# Patient Record
Sex: Male | Born: 1965 | Race: White | Hispanic: No | Marital: Married | Smoking: Former smoker
Health system: Southern US, Community
[De-identification: ages and names within clinical notes are randomized; demographics above are authoritative.]

## PROBLEM LIST (undated history)

## (undated) DIAGNOSIS — I2510 Atherosclerotic heart disease of native coronary artery without angina pectoris: Secondary | ICD-10-CM

## (undated) DIAGNOSIS — E039 Hypothyroidism, unspecified: Secondary | ICD-10-CM

## (undated) DIAGNOSIS — E785 Hyperlipidemia, unspecified: Secondary | ICD-10-CM

## (undated) DIAGNOSIS — K219 Gastro-esophageal reflux disease without esophagitis: Secondary | ICD-10-CM

## (undated) DIAGNOSIS — E7800 Pure hypercholesterolemia, unspecified: Secondary | ICD-10-CM

## (undated) DIAGNOSIS — I209 Angina pectoris, unspecified: Secondary | ICD-10-CM

## (undated) DIAGNOSIS — C6290 Malignant neoplasm of unspecified testis, unspecified whether descended or undescended: Secondary | ICD-10-CM

## (undated) HISTORY — DX: Hypothyroidism, unspecified: E03.9

## (undated) HISTORY — PX: UMBILICAL HERNIA REPAIR: SHX196

## (undated) HISTORY — DX: Gastro-esophageal reflux disease without esophagitis: K21.9

## (undated) HISTORY — PX: TONSILLECTOMY: SUR1361

## (undated) HISTORY — PX: ORCHIECTOMY: SHX2116

## (undated) HISTORY — DX: Malignant neoplasm of unspecified testis, unspecified whether descended or undescended: C62.90

## (undated) HISTORY — DX: Atherosclerotic heart disease of native coronary artery without angina pectoris: I25.10

## (undated) HISTORY — DX: Hyperlipidemia, unspecified: E78.5

---

## 2007-04-01 DIAGNOSIS — C6290 Malignant neoplasm of unspecified testis, unspecified whether descended or undescended: Secondary | ICD-10-CM

## 2007-04-01 HISTORY — DX: Malignant neoplasm of unspecified testis, unspecified whether descended or undescended: C62.90

## 2010-01-09 ENCOUNTER — Ambulatory Visit: Payer: Self-pay | Admitting: Internal Medicine

## 2010-01-09 ENCOUNTER — Encounter: Payer: Self-pay | Admitting: Internal Medicine

## 2010-01-09 ENCOUNTER — Encounter (INDEPENDENT_AMBULATORY_CARE_PROVIDER_SITE_OTHER): Payer: Self-pay | Admitting: *Deleted

## 2010-01-09 DIAGNOSIS — E039 Hypothyroidism, unspecified: Secondary | ICD-10-CM | POA: Insufficient documentation

## 2010-01-09 DIAGNOSIS — R0609 Other forms of dyspnea: Secondary | ICD-10-CM | POA: Insufficient documentation

## 2010-01-09 DIAGNOSIS — E785 Hyperlipidemia, unspecified: Secondary | ICD-10-CM | POA: Insufficient documentation

## 2010-01-09 DIAGNOSIS — R0989 Other specified symptoms and signs involving the circulatory and respiratory systems: Secondary | ICD-10-CM | POA: Insufficient documentation

## 2010-01-09 LAB — CONVERTED CEMR LAB
ALT: 31 units/L (ref 0–53)
AST: 26 units/L (ref 0–37)
Albumin: 4.6 g/dL (ref 3.5–5.2)
BUN: 17 mg/dL (ref 6–23)
Chloride: 101 meq/L (ref 96–112)
Eosinophils Relative: 1.1 % (ref 0.0–5.0)
Glucose, Bld: 93 mg/dL (ref 70–99)
HCT: 44 % (ref 39.0–52.0)
Lymphs Abs: 2.3 10*3/uL (ref 0.7–4.0)
MCV: 87.9 fL (ref 78.0–100.0)
Monocytes Absolute: 0.6 10*3/uL (ref 0.1–1.0)
Platelets: 191 10*3/uL (ref 150.0–400.0)
Potassium: 4.5 meq/L (ref 3.5–5.1)
TSH: 0.85 microintl units/mL (ref 0.35–5.50)
Total Protein: 7.2 g/dL (ref 6.0–8.3)
WBC: 7.9 10*3/uL (ref 4.5–10.5)

## 2010-01-10 ENCOUNTER — Telehealth: Payer: Self-pay | Admitting: Internal Medicine

## 2010-01-14 ENCOUNTER — Ambulatory Visit: Payer: Self-pay | Admitting: Diagnostic Radiology

## 2010-01-14 ENCOUNTER — Telehealth: Payer: Self-pay | Admitting: Internal Medicine

## 2010-01-14 ENCOUNTER — Ambulatory Visit (HOSPITAL_BASED_OUTPATIENT_CLINIC_OR_DEPARTMENT_OTHER): Admission: RE | Admit: 2010-01-14 | Discharge: 2010-01-14 | Payer: Self-pay | Admitting: Internal Medicine

## 2010-01-23 ENCOUNTER — Ambulatory Visit: Payer: Self-pay | Admitting: Internal Medicine

## 2010-01-23 DIAGNOSIS — K219 Gastro-esophageal reflux disease without esophagitis: Secondary | ICD-10-CM | POA: Insufficient documentation

## 2010-01-25 ENCOUNTER — Ambulatory Visit: Payer: Self-pay | Admitting: Internal Medicine

## 2010-01-28 LAB — CONVERTED CEMR LAB
Cholesterol: 159 mg/dL (ref 0–200)
VLDL: 23.2 mg/dL (ref 0.0–40.0)

## 2010-03-07 ENCOUNTER — Ambulatory Visit: Payer: Self-pay | Admitting: Internal Medicine

## 2010-03-07 DIAGNOSIS — C6290 Malignant neoplasm of unspecified testis, unspecified whether descended or undescended: Secondary | ICD-10-CM | POA: Insufficient documentation

## 2010-03-07 DIAGNOSIS — K645 Perianal venous thrombosis: Secondary | ICD-10-CM | POA: Insufficient documentation

## 2010-03-27 ENCOUNTER — Ambulatory Visit
Admission: RE | Admit: 2010-03-27 | Discharge: 2010-03-27 | Payer: Self-pay | Source: Home / Self Care | Attending: Internal Medicine | Admitting: Internal Medicine

## 2010-03-27 LAB — CONVERTED CEMR LAB
Bilirubin Urine: NEGATIVE
Blood in Urine, dipstick: NEGATIVE
Ketones, urine, test strip: NEGATIVE
Protein, U semiquant: NEGATIVE
Urobilinogen, UA: 0.2

## 2010-03-31 HISTORY — PX: CARDIAC CATHETERIZATION: SHX172

## 2010-04-12 ENCOUNTER — Telehealth: Payer: Self-pay | Admitting: Internal Medicine

## 2010-04-18 ENCOUNTER — Ambulatory Visit
Admission: RE | Admit: 2010-04-18 | Discharge: 2010-04-18 | Payer: Self-pay | Source: Home / Self Care | Attending: Internal Medicine | Admitting: Internal Medicine

## 2010-04-29 ENCOUNTER — Encounter
Admission: RE | Admit: 2010-04-29 | Discharge: 2010-04-29 | Payer: Self-pay | Source: Home / Self Care | Attending: Internal Medicine | Admitting: Internal Medicine

## 2010-04-30 NOTE — Progress Notes (Signed)
Summary: workup negative, come back in 2 weeks for recheck  Phone Note Outgoing Call   Summary of Call: workup unremarkable including a CT of the chest, unclear etiology. Atypical angina? that would be unlikely in somebody his age without diabetes. deconditioning? Plan: advise patient to come back in 2 weeks for reassessment Jose E. Paz MD  January 14, 2010 1:52 PM   Follow-up for Phone Call        Pt is aware. Will make appt.  Army Fossa CMA  January 14, 2010 1:58 PM

## 2010-04-30 NOTE — Letter (Signed)
Summary: Out of Work  [facility]  [address] [place], [place] [address]   Phone: [phone]  Fax: [phone]    January 09, 2010   Employee:  Gavin Parker    To Whom It May Concern:   For Medical reasons, please excuse the above named employee from work for the following dates:  Start:   January 09, 2010  End:   January 09, 2010   If you need additional information, please feel free to contact our office.         Sincerely,    Willow Ora, MD

## 2010-04-30 NOTE — Assessment & Plan Note (Signed)
Summary: HEMORRHOIDS/RH.........Marland Kitchen   Vital Signs:  Patient profile:   45 year old male Weight:      224.31 pounds Pulse rate:   72 / minute Pulse rhythm:   regular BP sitting:   126 / 80  (left arm) Cuff size:   regular  Vitals Entered By: Army Fossa CMA (March 07, 2010 10:53 AM) CC: Pt here c/o possible hemorrhoids Comments x 2 weeks on the outside [facility]    History of Present Illness: long history of hemorrhoids 2 weeks ago one of his hemorrhoids felt larger. Initially saw some blood but no bleeding in the last week. Mild pain  review of systems No abdominal pain, no nausea vomiting  Current Medications (verified): 1)  Zocor 40 Mg Tabs (Simvastatin) .Marland Kitchen.. 1 By Mouth Daily. 2)  Prevacid 30 Mg Cpdr (Lansoprazole) .Marland Kitchen.. 1 By Mouth Daily. 3)  Levothroid 100 Mcg Tabs (Levothyroxine Sodium) .Marland Kitchen.. 1 By Mouth Daily.  Allergies (verified): No Known Drug Allergies  Past History:  Past Medical History: Reviewed history from 01/09/2010 and no changes required. 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 last visit w/  urology June 2011 ([place]); last CT of the abdomen-chest 10-11 ([place]) per patient  GERD Hyperlipidemia Hypothyroidism Cscope  ~ 03-2008  (OK per patient)  Past Surgical History: Reviewed history from 01/09/2010 and no changes required. orchiectomy Hernia- stomach Tonsillectomy  Social History: Reviewed history from 01/09/2010 and no changes required. Married 3 children  moved form [place] 3-11 Smoked 1 ppd , quit 2006 aprox.  Alcohol use-yes Drug use-no Regular exercise-no  Physical Exam  General:  alert and well-developed.   Abdomen:  soft, non-tender, no distention, no masses, and no guarding.   Rectal:  external exam show a 1.8 cm fluctuant, no red or warm hemorrhoid at around 7 oclock. Digital rectal exam showed no mass   Impression & Recommendations:  Problem # 1:  EXTERNAL HEMORRHOIDS, THROMBOSED  (ICD-455.4) he has a thrombosed external hemorrhoid. Pain is very mild No bleeding  plan: see  instructions recommended surgical referral if not better  Problem # 2:  TESTICULAR CANCER (ICD-186.9) was diagnosed with testicular cancer in 2009, treated in [place]. Last visit with his urologist in [place] ---> June 2011 he is due for a urology visit in June 2012. I like to get the process started and refer him to a local urologist but he declined.  Complete Medication List: 1)  Zocor 40 Mg Tabs (Simvastatin) .Marland Kitchen.. 1 by mouth daily. 2)  Prevacid 30 Mg Cpdr (Lansoprazole) .Marland Kitchen.. 1 by mouth daily. 3)  Levothroid 100 Mcg Tabs (Levothyroxine sodium) .Marland Kitchen.. 1 by mouth daily. 4)  Hydrocortisone 2.5 % Crea (Hydrocortisone) .... Apply 3 times a day to hemorrhoids for one week  Patient Instructions: 1)  nupercainal 3 times a day (OTC) combined with hydrocortisone 2.5% cream. 2)  Metamucil 2 capsules daily 3)  sitz baths  4)  call for surgical referral if not better in a few days or if pain and bleeding become an issue Prescriptions: HYDROCORTISONE 2.5 % CREA (HYDROCORTISONE) apply 3 times a day to hemorrhoids for one week  #1 x 0   Entered and Authorized by:   Nolon Rod. Paz MD   Signed by:   Nolon Rod. Paz MD on 03/07/2010   Method used:   Print then Give to Patient   RxID:   [id number]    Orders Added: 1)  Est. Patient Level III [78469]

## 2010-04-30 NOTE — Assessment & Plan Note (Signed)
Summary: NEW TO ESTAB/BRONITITS//PH   Vital Signs:  Patient profile:   45 year old male Height:      71 inches Weight:      225 pounds BMI:     31.49 O2 Sat:      96 % on Room air Pulse rate:   72 / minute Pulse rhythm:   regular BP sitting:   124 / 86  (left arm) Cuff size:   regular  Vitals Entered By: Army Fossa CMA (January 09, 2010 1:30 PM)  O2 Flow:  Room air  History of Present Illness: new patient, past medical history of testicular cancer Chief complaint --DOE  He started to notice dyspnea on exertion for the last 4-5 months. No shortness of breath at rest. He has been seen at another  facility 3 times with those symptoms:  status post 2 antibiotics was also  prescribed inhalers, singulair, Mucinex and "cough pearls" none of  these measures brought significant relief  ROS no fever Started to cough  only a few days ago when he started to take Mucinex, no sputum No wheezing per se Admits to postnasal dripping but no itchy eyes or itchy nose No vomiting, diarrhea or blood in the stools. Some nausea GERD symptoms well controlled  No substernal chest pain, orthopnea, lower extremity edema He moved from [place] to-2011, since then has been exposed to for formaldehyde at his job.    Preventive Screening-Counseling & Management  Alcohol-Tobacco     Smoking Status: never  Caffeine-Diet-Exercise     Does Patient Exercise: no      Drug Use:  no.    Current Medications (verified): 1)  Zocor 40 Mg Tabs (Simvastatin) .Marland Kitchen.. 1 By Mouth Daily. 2)  Prevacid 30 Mg Cpdr (Lansoprazole) .Marland Kitchen.. 1 By Mouth Daily. 3)  Levothroid 100 Mcg Tabs (Levothyroxine Sodium) .Marland Kitchen.. 1 By Mouth Daily. 4)  Singulair 10 Mg Tabs (Montelukast Sodium) .Marland Kitchen.. 1 By Mouth Daily.  Allergies (verified): No Known Drug Allergies  Past History:  Family History: Last updated: 01/09/2010 Family History of Arthritis Family History Diabetes 1st degree relative Family History Ovarian  cancer  Social History: Last updated: 01/09/2010 Married 3 children  moved form [place] 3-11 Smoked 1 ppd , quit 2006 aprox.  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 last visit w/  urology June 2011 ([place]); last CT of the abdomen-chest 10-11 ([place]) per patient  GERD Hyperlipidemia Hypothyroidism Cscope  ~ 03-2008  (OK per patient)  Past Surgical History: orchiectomy Hernia- stomach Tonsillectomy  Family History: Family History of Arthritis Family History Diabetes 1st degree relative Family History Ovarian cancer  Social History: Married 3 children  moved form [place] 3-11 Smoked 1 ppd , quit 2006 aprox.  Alcohol use-yes Drug use-no Regular exercise-no Smoking Status:  never Drug Use:  no Does Patient Exercise:  no  Physical Exam  General:  alert, well-developed, and well-nourished.  no apparent distress Neck:  no neck or supraclavicular masses or lymphadenopathy . No JVD at 43 Lungs:  decreased breath sounds more noticeable a day left base. No wheezing, crackles, increased work of breathing Heart:  normal rate, regular rhythm, no murmur, no gallop, and no rub.   Abdomen:  soft, non-tender, no distention, no masses, no guarding, and no rigidity.   Extremities:  no lower extremity edema Psych:  Oriented X3, memory intact  for recent and remote, normally interactive, good eye contact, not anxious appearing, and not depressed appearing.     Impression & Recommendations:  Problem # 1:  DYSPNEA ON EXERTION (ICD-786.09)  dyspnea on exertion for several months O2 sat normal DDX is extensive: reactive airway disease, atypical pneumonia, metastases given history of testicular cancer, chronic PE,pericardial effusion, exposure to chemicals at work ( formaldehyde ), medical illnesses such as  anemia et Karie Soda. Plan: EKG--normal sinus rhythm Chest x-ray Labs discontinued medications such as Singulair or inhalers. If workup unrevealing,  he likely will need further eval:  CT of the chest , PFTs, pulmonary consult etc.  Orders: Venipuncture (84696) TLB-BMP (Basic Metabolic Panel-BMET) (80048-METABOL) TLB-CBC Platelet - w/Differential (85025-CBCD) TLB-TSH (Thyroid Stimulating Hormone) (84443-TSH) TLB-Hepatic/Liver Function Pnl (80076-HEPATIC) T-2 View CXR (71020TC) Specimen Handling (29528) EKG w/ Interpretation (93000)  Problem # 2:  HYPOTHYROIDISM (ICD-244.9)  His updated medication list for this problem includes:    Levothroid 100 Mcg Tabs (Levothyroxine sodium) .Marland Kitchen... 1 by mouth daily.  Orders: TLB-TSH (Thyroid Stimulating Hormone) (84443-TSH) Specimen Handling (41324)  Complete Medication List: 1)  Zocor 40 Mg Tabs (Simvastatin) .Marland Kitchen.. 1 by mouth daily. 2)  Prevacid 30 Mg Cpdr (Lansoprazole) .Marland Kitchen.. 1 by mouth daily. 3)  Levothroid 100 Mcg Tabs (Levothyroxine sodium) .Marland Kitchen.. 1 by mouth daily.  Patient Instructions: 1)  Please schedule a follow-up appointment in 2 weeks.    Immunization History:  Tetanus/Td Immunization History:    Tetanus/Td:  historical (03/31/2008)

## 2010-04-30 NOTE — Assessment & Plan Note (Signed)
Summary: 2 WEEKS OV//PH   Vital Signs:  Patient profile:   45 year old male Weight:      224.38 pounds Pulse rate:   82 / minute Pulse rhythm:   regular BP sitting:   126 / 80  (left arm) Cuff size:   regular  Vitals Entered By: Army Fossa CMA (January 23, 2010 11:00 AM) CC: 2 week follow up- not fasting  Comments [facility] [employer]    History of Present Illness: here for a followup He was seen with dyspnea on exertion, workup negative. Overall, feels like he is getting back to normal, symptoms are less noticeable.    Current Medications (verified): 1)  Zocor 40 Mg Tabs (Simvastatin) .Marland Kitchen.. 1 By Mouth Daily. 2)  Prevacid 30 Mg Cpdr (Lansoprazole) .Marland Kitchen.. 1 By Mouth Daily. 3)  Levothroid 100 Mcg Tabs (Levothyroxine Sodium) .Marland Kitchen.. 1 By Mouth Daily.  Allergies (verified): No Known Drug Allergies  Past History:  Past Medical History: Reviewed history from 01/09/2010 and no changes required. 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 last visit w/  urology June 2011 ([place]); last CT of the abdomen-chest 10-11 ([place]) per patient  GERD Hyperlipidemia Hypothyroidism Cscope  ~ 03-2008  (OK per patient)  Past Surgical History: Reviewed history from 01/09/2010 and no changes required. orchiectomy Hernia- stomach Tonsillectomy  Social History: Reviewed history from 01/09/2010 and no changes required. Married 3 children  moved form [place] 3-11 Smoked 1 ppd , quit 2006 aprox.  Alcohol use-yes Drug use-no Regular exercise-no  Review of Systems       denies chest pain GERD symptoms well controlled No wheezing He admits to postnasal drip and for which he has a nasal steroid (name?) Early in the mornings he sometimes has cough, thinks related to postnasal drip and phlegm  accumulation in the throat   Physical Exam  General:  alert and well-developed.  alert and well-developed.   Lungs:  normal respiratory effort, no intercostal retractions, no  accessory muscle use, and normal breath sounds.   Heart:  normal rate, regular rhythm, no murmur, and no gallop.  normal rate, regular rhythm, no murmur, and no gallop.   Extremities:  no lower  extremity edema Psych:  not anxious appearing and not depressed appearing.  not anxious appearing and not depressed appearing.     Impression & Recommendations:  Problem # 1:  DYSPNEA ON EXERTION (ICD-786.09) workup negative, patient feeling better. We agreed to reassess in 4 months, in the meantime if he is not back to baseline in  few weeks, he is to let me know. Will consider a referral to cardiology  or pulmonary medicine  Problem # 2:  HYPERLIPIDEMIA (ICD-272.4) due for labs, see instructions His updated medication list for this problem includes:    Zocor 40 Mg Tabs (Simvastatin) .Marland Kitchen... 1 by mouth daily.  Labs Reviewed: SGOT: 26 (01/09/2010)   SGPT: 31 (01/09/2010)  His updated medication list for this problem includes:    Zocor 40 Mg Tabs (Simvastatin) .Marland Kitchen... 1 by mouth daily.  Problem # 3:  HYPOTHYROIDISM (ICD-244.9) refills provided His updated medication list for this problem includes:    Levothroid 100 Mcg Tabs (Levothyroxine sodium) .Marland Kitchen... 1 by mouth daily.  His updated medication list for this problem includes:    Levothroid 100 Mcg Tabs (Levothyroxine sodium) .Marland Kitchen... 1 by mouth daily.  Problem # 4:  GERD (ICD-530.81) refills provided His updated medication list for this problem includes:    Prevacid 30 Mg Cpdr (Lansoprazole) .Marland KitchenMarland KitchenMarland KitchenMarland Kitchen  1 by mouth daily.  His updated medication list for this problem includes:    Prevacid 30 Mg Cpdr (Lansoprazole) .Marland Kitchen... 1 by mouth daily.  Complete Medication List: 1)  Zocor 40 Mg Tabs (Simvastatin) .Marland Kitchen.. 1 by mouth daily. 2)  Prevacid 30 Mg Cpdr (Lansoprazole) .Marland Kitchen.. 1 by mouth daily. 3)  Levothroid 100 Mcg Tabs (Levothyroxine sodium) .Marland Kitchen.. 1 by mouth daily.  Patient Instructions: 1)  came back fasting within the next week: FLP--- dx high  cholesterol 2)  Please schedule a follow-up appointment in 4 months .  Prescriptions: ZOCOR 40 MG TABS (SIMVASTATIN) 1 by mouth daily.  #90 x 1   Entered and Authorized by:   Nolon Rod. Isaiah Torok MD   Signed by:   Nolon Rod. Chakira Jachim MD on 01/23/2010   Method used:   Print then Give to Patient   RxID:   [id number] PREVACID 30 MG CPDR (LANSOPRAZOLE) 1 by mouth daily.  #90 x 1   Entered and Authorized by:   Nolon Rod. Reshanda Lewey MD   Signed by:   Nolon Rod. Ahnesty Finfrock MD on 01/23/2010   Method used:   Print then Give to Patient   RxID:   [id number] LEVOTHROID 100 MCG TABS (LEVOTHYROXINE SODIUM) 1 by mouth daily.  #90 x 1   Entered and Authorized by:   Nolon Rod. Graylin Sperling MD   Signed by:   Nolon Rod. Carmie Lanpher MD on 01/23/2010   Method used:   Print then Give to Patient   RxID:   [id number]    Orders Added: 1)  Est. Patient Level III [40347]

## 2010-04-30 NOTE — Progress Notes (Signed)
Summary: Labs/CXR  Phone Note Outgoing Call   Summary of Call: advise patient: all labs and CXR neg rec: CT chest angiogram, r/o PE, dx DOE Jose E. Paz MD  January 10, 2010 9:10 PM   Follow-up for Phone Call        Left message for pt to call back. Army Fossa CMA  January 11, 2010 9:59 AM  ok to call him  at home number [phone].Jerolyn Shin  January 11, 2010 10:30 AM  Additional Follow-up for Phone Call Additional follow up Details #1::        Spoke with pt, pt is aware. Army Fossa CMA  January 11, 2010 10:44 AM

## 2010-05-02 NOTE — Assessment & Plan Note (Signed)
Summary: TESTICLE SORENESS/KN   Vital Signs:  Patient profile:   45 year old male Weight:      230.25 pounds Pulse rate:   80 / minute Pulse rhythm:   regular BP sitting:   132 / 86  (left arm) Cuff size:   regular  Vitals Entered By: Army Fossa CMA (April 18, 2010 10:42 AM) CC: Pt here to discuss testicle soreness Comments not fasting [facility] [employer]    History of Present Illness: was seen last month with ill-defined left scrotal pain.  epididimus  was felt to be slightly tender to palpation, he took antibiotics for presumed epididymitis. he also had some back pain and leftgroin pain. Overall symptoms are better as of today but even yesterday, he felt some discomfort at the left scrotum  ROS No fever No dysuria Self testicular exam no mass  Current Medications (verified): 1)  Zocor 40 Mg Tabs (Simvastatin) .Marland Kitchen.. 1 By Mouth Daily. 2)  Prevacid 30 Mg Cpdr (Lansoprazole) .Marland Kitchen.. 1 By Mouth Daily. 3)  Levothroid 100 Mcg Tabs (Levothyroxine Sodium) .Marland Kitchen.. 1 By Mouth Daily.  Allergies (verified): No Known Drug Allergies  Past History:  Past Medical History: Reviewed history from 01/09/2010 and no changes required. 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 last visit w/  urology June 2011 ([place]); last CT of the abdomen-chest 10-11 ([place]) per patient  GERD Hyperlipidemia Hypothyroidism Cscope  ~ 03-2008  (OK per patient)  Past Surgical History: Reviewed history from 03/27/2010 and no changes required. orchiectomy-- R Hernia- stomach Tonsillectomy  Social History: Reviewed history from 01/09/2010 and no changes required. Married 3 children  moved form [place] 3-11 Smoked 1 ppd , quit 2006 aprox.  Alcohol use-yes Drug use-no Regular exercise-no  Physical Exam  General:  alert and well-developed.   Genitalia:  circumcised, no cutaneous lesions, and no urethral discharge.   R testicle surgically absent L testicle w/o mass , epididimus  normal in size, slightly  tender throughout   Impression & Recommendations:  Problem # 1:  ? of EPIDIDYMITIS, LEFT (ICD-604.90)  the left epididymis continued to be  slightly tender. On further questioning, the patient thinks that that is the way it has been x long time He is somehow anxious about it. I think the best way to assess this issue is to do a  ultrasound. Will schedule----needs to be in the morning. Has an  appointment to see urology next month  Orders: Radiology Referral (Radiology)  Complete Medication List: 1)  Zocor 40 Mg Tabs (Simvastatin) .Marland Kitchen.. 1 by mouth daily. 2)  Prevacid 30 Mg Cpdr (Lansoprazole) .Marland Kitchen.. 1 by mouth daily. 3)  Levothroid 100 Mcg Tabs (Levothyroxine sodium) .Marland Kitchen.. 1 by mouth daily.   Orders Added: 1)  Radiology Referral [Radiology] 2)  Est. Patient Level III [[address]]

## 2010-05-02 NOTE — Progress Notes (Signed)
Summary: needs referral to Urologist  Phone Note Call from Patient   Caller: Patient Summary of Call: Patient called to see if Dr Drue Novel would refer him to a Urologist here in [place]----He used to live in [place] and had been seeing a Urologist up in [place] once a year due to testicular cancer---his next appointment would have been in June with the [place] doctor----but he would like to get established with someone down here soon and not wait until June  He works in the afternoon so would prefer a morning appointment (Monday is usually the best --except for this Manday, 1/16---but will take any day in the morning)      please call him on his cell--[phone]--ok to leave message Initial call taken by: Jerolyn Shin,  April 12, 2010 2:23 PM  Follow-up for Phone Call        ?Specific MD? Lucious Groves CMA  April 12, 2010 2:27 PM   Additional Follow-up for Phone Call Additional follow up Details #1::        refer to [facility] urology Charde Macfarlane E. Osher Oettinger MD  April 14, 2010 1:54 PM     Additional Follow-up for Phone Call Additional follow up Details #2::    Patient aware. Follow-up by: Lucious Groves CMA,  April 15, 2010 9:34 AM

## 2010-05-02 NOTE — Assessment & Plan Note (Signed)
Summary: GROIN PROBLEM//PH   Vital Signs:  Patient profile:   45 year old male Weight:      231.13 pounds Pulse rate:   73 / minute Pulse rhythm:   regular BP sitting:   126 / 80  (left arm) Cuff size:   regular  Vitals Entered By: Army Fossa CMA (March 27, 2010 9:35 AM) CC: Pt here c/o throbbing pain in testicular area, also pain in (L) side of neck. Comments x 1 week [facility] [employer]    History of Present Illness: 1 week h/o of L scrotal dyscomfort, on-off "throbbing" STE showed no mass, some tenderness at lower pole of the L testicle  also L sided neck pain last week--- self resolved   ROS no fever no dysuria, hematuria, penile d/c  no groin mass  no ear d/c, neck mass   Current Medications (verified): 1)  Zocor 40 Mg Tabs (Simvastatin) .Marland Kitchen.. 1 By Mouth Daily. 2)  Prevacid 30 Mg Cpdr (Lansoprazole) .Marland Kitchen.. 1 By Mouth Daily. 3)  Levothroid 100 Mcg Tabs (Levothyroxine Sodium) .Marland Kitchen.. 1 By Mouth Daily.  Allergies (verified): No Known Drug Allergies  Past History:  Past Medical History: Reviewed history from 01/09/2010 and no changes required. 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 last visit w/  urology June 2011 ([place]); last CT of the abdomen-chest 10-11 ([place]) per patient  GERD Hyperlipidemia Hypothyroidism Cscope  ~ 03-2008  (OK per patient)  Past Surgical History: orchiectomy-- R Hernia- stomach Tonsillectomy  Social History: Reviewed history from 01/09/2010 and no changes required. Married 3 children  moved form [place] 3-11 Smoked 1 ppd , quit 2006 aprox.  Alcohol use-yes Drug use-no Regular exercise-no  Physical Exam  General:  alert, well-developed, and well-nourished.   Ears:  TMs w/o redness or d/c.  L TM  w/ several white patches (scars) Neck:  no mass at neck or supraclavicular area. ROM normal  Abdomen:  soft, non-tender, and no inguinal hernia.   Genitalia:  circumcised, no cutaneous lesions, and no urethral  discharge.   R testicle surgically absent L testicle w/o mass , epididimus normal in size, slightly  tender at the distal aspect?   Impression & Recommendations:  Problem # 1:  ? of EPIDIDYMITIS, LEFT (ICD-604.90)  1 week h/o L sided dyscomfort, already slightly  better ?resolving epididimitis plan: Abx  call if symptoms resurface  Orders: UA Dipstick w/o Micro (manual) ([address])  Problem # 2:  neck pain resolved, exam normal   Complete Medication List: 1)  Zocor 40 Mg Tabs (Simvastatin) .Marland Kitchen.. 1 by mouth daily. 2)  Prevacid 30 Mg Cpdr (Lansoprazole) .Marland Kitchen.. 1 by mouth daily. 3)  Levothroid 100 Mcg Tabs (Levothyroxine sodium) .Marland Kitchen.. 1 by mouth daily. 4)  Doxycycline Hyclate 100 Mg Caps (Doxycycline hyclate) .Marland Kitchen.. 1 by mouth two times a day  Patient Instructions: 1)  call if not back to normal in 1 week 2)  take antibiotics as prescribed  Prescriptions: DOXYCYCLINE HYCLATE 100 MG CAPS (DOXYCYCLINE HYCLATE) 1 by mouth two times a day  #20 x 0   Entered and Authorized by:   Nolon Rod. Paz MD   Signed by:   Nolon Rod. Paz MD on 03/27/2010   Method used:   Print then Give to Patient   RxID:   [id number]    Orders Added: 1)  Est. Patient Level III [95621] 2)  UA Dipstick w/o Micro (manual) [81002]    Laboratory Results   Urine Tests    Routine Urinalysis  Color: yellow Appearance: Clear Glucose: negative   (Normal Range: Negative) Bilirubin: negative   (Normal Range: Negative) Ketone: negative   (Normal Range: Negative) Spec. Gravity: >=1.030   (Normal Range: 1.003-1.035) Blood: negative   (Normal Range: Negative) pH: 6.0   (Normal Range: 5.0-8.0) Protein: negative   (Normal Range: Negative) Urobilinogen: 0.2   (Normal Range: 0-1) Nitrite: negative   (Normal Range: Negative) Leukocyte Esterace: negative   (Normal Range: Negative)

## 2010-05-27 ENCOUNTER — Encounter: Payer: Self-pay | Admitting: Family Medicine

## 2010-05-27 ENCOUNTER — Encounter (INDEPENDENT_AMBULATORY_CARE_PROVIDER_SITE_OTHER): Payer: Self-pay | Admitting: *Deleted

## 2010-05-27 ENCOUNTER — Ambulatory Visit (INDEPENDENT_AMBULATORY_CARE_PROVIDER_SITE_OTHER): Payer: Self-pay | Admitting: Family Medicine

## 2010-05-27 DIAGNOSIS — J309 Allergic rhinitis, unspecified: Secondary | ICD-10-CM

## 2010-06-06 NOTE — Letter (Signed)
Summary: Out of Work  [facility] [employer]  [address] [place], [place] [address]   Phone: [phone]  Fax: [phone]    May 27, 2010   Employee:  CONNELLY [place]TER[place]LE    To Whom It May Concern:   For Medical reasons, please excuse the above named employee from work for the following dates:  Start:  May 27, 2010  End:  May 27, 2010    If you need additional information, please feel free to contact our office.         Sincerely,      Neena Rhymes, MD

## 2010-06-06 NOTE — Assessment & Plan Note (Signed)
Summary: sore throat, cough///sph   Vital Signs:  Patient profile:   45 year old male Height:      71 inches Weight:      230 pounds O2 Sat:      98 % on Room air Temp:     98.3 degrees F oral Pulse rate:   68 / minute BP sitting:   100 / 62  (left arm)  Vitals Entered By: Jeremy Johann CMA (May 27, 2010 1:34 PM)  O2 Flow:  Room air CC: sore throat, cough,  drainage x2 days   History of Present Illness: 45 yo man here today for URI sxs.  daughter was recently sick.  pt's sxs started 2 days ago.  will wake up w/ difficulty swallowing, coughing, nasal congestion, sore throat.  ears are popping.  hx of seasonal allergies, not currently on meds.  no fevers.  cough is rarely productive.  pt reports that conservative measures (allergy txs) 'never work' and 'i always get bronchitis'.  Current Medications (verified): 1)  Zocor 40 Mg Tabs (Simvastatin) .Marland Kitchen.. 1 By Mouth Daily. 2)  Prevacid 30 Mg Cpdr (Lansoprazole) .Marland Kitchen.. 1 By Mouth Daily. 3)  Levothroid 100 Mcg Tabs (Levothyroxine Sodium) .Marland Kitchen.. 1 By Mouth Daily. 4)  Fluticasone Propionate 50 Mcg/act  Susp (Fluticasone Propionate) .... 2 Sprays Each Nostril Once Daily 5)  Azithromycin 250 Mg  Tabs (Azithromycin) .... 2 By  Mouth Today and Then 1 Daily For 4 Days  Allergies (verified): No Known Drug Allergies  Review of Systems      See HPI  Physical Exam  General:  alert and well-developed.   Head:  NCAT, no TTP over sinuses Eyes:  no injxn or inflammation Ears:  TMs w/o redness or d/c.  L TM  w/ several white patches (scars) Nose:  edematous turbinates Mouth:  + PND Neck:  supple, no LAD Lungs:  normal respiratory effort, no intercostal retractions, no accessory muscle use, and normal breath sounds.   Heart:  normal rate, regular rhythm, no murmur, and no gallop.  normal rate, regular rhythm, no murmur, and no gallop.     Impression & Recommendations:  Problem # 1:  RHINITIS (ICD-477.9) Assessment New pt's sxs are most  likely due to untreated nasal allergies.  start OTC antihistamine and nasal steroid.  given pt's insistance on bronchitis and ineffectiveness of more conservative measures will give Zpack script for pt to pick up if no improvement or sxs are worsening.  reviewed supportive care and red flags that should prompt return.  Pt expresses understanding and is in agreement w/ this plan. His updated medication list for this problem includes:    Fluticasone Propionate 50 Mcg/act Susp (Fluticasone propionate) .Marland Kitchen... 2 sprays each nostril once daily  Complete Medication List: 1)  Zocor 40 Mg Tabs (Simvastatin) .Marland Kitchen.. 1 by mouth daily. 2)  Prevacid 30 Mg Cpdr (Lansoprazole) .Marland Kitchen.. 1 by mouth daily. 3)  Levothroid 100 Mcg Tabs (Levothyroxine sodium) .Marland Kitchen.. 1 by mouth daily. 4)  Fluticasone Propionate 50 Mcg/act Susp (Fluticasone propionate) .... 2 sprays each nostril once daily 5)  Azithromycin 250 Mg Tabs (Azithromycin) .... 2 by  mouth today and then 1 daily for 4 days  Patient Instructions: 1)  This appears to be allergy related 2)  Start Zyrtec daily 3)  Use the nasal spray as directed 4)  Add Mucinex to thin your chest congestion (available over the counter) 5)  Drink plenty of fluids 6)  Tylenol or ibuprofen as needed for pain or fever  7)  If no improvement, or worsening, in the next 5 days- start the Azithromycin 8)  Call with any questions or concerns 9)  Hang in there! Prescriptions: AZITHROMYCIN 250 MG  TABS (AZITHROMYCIN) 2 by  mouth today and then 1 daily for 4 days  #6 x 0   Entered and Authorized by:   Neena Rhymes MD   Signed by:   Neena Rhymes MD on 05/27/2010   Method used:   Electronically to        [facility] [address] (retail)       [phone]       [place], [place]  [address]       Ph: [phone]       Fax: [phone]   RxID:   [id number] FLUTICASONE PROPIONATE 50 MCG/ACT  SUSP (FLUTICASONE PROPIONATE) 2 sprays each nostril once  daily  #1 vial x 3   Entered and Authorized by:   Neena Rhymes MD   Signed by:   Neena Rhymes MD on 05/27/2010   Method used:   Electronically to        [facility] [address] (retail)       [id number]       [place], [place]  [address]       Ph: [phone]       Fax: [phone]   RxID:   [id number]    Orders Added: 1)  Est. Patient Level III [81856]

## 2010-07-03 ENCOUNTER — Encounter: Payer: Self-pay | Admitting: Internal Medicine

## 2010-07-03 ENCOUNTER — Ambulatory Visit (INDEPENDENT_AMBULATORY_CARE_PROVIDER_SITE_OTHER)
Admission: RE | Admit: 2010-07-03 | Discharge: 2010-07-03 | Disposition: A | Discharge status: Home / Self Care | Payer: BC Managed Care – PPO | Source: Ambulatory Visit | Attending: Internal Medicine | Admitting: Internal Medicine

## 2010-07-03 ENCOUNTER — Ambulatory Visit (INDEPENDENT_AMBULATORY_CARE_PROVIDER_SITE_OTHER): Payer: BC Managed Care – PPO | Admitting: Internal Medicine

## 2010-07-03 VITALS — BP 104/60 | HR 79 | Wt 227.0 lb

## 2010-07-03 DIAGNOSIS — R071 Chest pain on breathing: Secondary | ICD-10-CM

## 2010-07-03 DIAGNOSIS — R0789 Other chest pain: Secondary | ICD-10-CM

## 2010-07-03 NOTE — Patient Instructions (Signed)
Motrin or advil Call if no better in 2 weeks

## 2010-07-03 NOTE — Progress Notes (Signed)
  Subjective:    Patient ID: Gavin Parker, male    DOB: 09-21-1965, 45 y.o.   MRN: [id number]  HPI Developed pain in the left armpit 2 days ago, pain is on and off, worse when he touch the area or with certain movements. He also has ill-defined , throbbing pain at the left forearm, making a fist with his left hand feels a little tight. Denies any specific injury, no swelling, redness or discharge noted  Past Medical History  Diagnosis Date  . 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 Last visit w/ urology June 2011 ([place]); last CT of  the abdomen -chest in [place]:  10/11  . GERD (gastroesophageal reflux disease)   . Hyperlipidemia   . Hypothyroidism   . S/P colonoscopy     ~  03-2008, ok per pt    Past Surgical History  Procedure Date  . Orchiectomy     R  . Hernia repair     stomach  . Tonsillectomy       Review of Systems No fever or chills No cough No  difficulty breathing or substernal chest pain No neck pain    Objective:   Physical Exam  Constitutional: He appears well-developed and well-nourished.  Pulmonary/Chest: No respiratory distress.       Chest wall is tender at the area of the left armpit  Musculoskeletal:       Arms:      Inspection and palpation of the shoulders, arms, elbows, wrists and hands normal. Range of motion is normal throughout. Hands and wrists without synovitis          Assessment & Plan:

## 2010-07-03 NOTE — Assessment & Plan Note (Addendum)
Sx c/w muscle skeletal chest wall pain. Unclear why he has throbbing pain in the left arm however examination of the abdomen is normal. Plan: Get a XR Motrin as needed If no better in 2 weeks, will need further XRs. Pt will let me know

## 2010-07-23 ENCOUNTER — Encounter: Payer: Self-pay | Admitting: Internal Medicine

## 2010-07-23 ENCOUNTER — Ambulatory Visit (INDEPENDENT_AMBULATORY_CARE_PROVIDER_SITE_OTHER): Payer: BC Managed Care – PPO | Admitting: Internal Medicine

## 2010-07-23 VITALS — BP 112/70 | HR 62 | Wt 226.6 lb

## 2010-07-23 DIAGNOSIS — R109 Unspecified abdominal pain: Secondary | ICD-10-CM

## 2010-07-23 DIAGNOSIS — R1030 Lower abdominal pain, unspecified: Secondary | ICD-10-CM

## 2010-07-23 LAB — POCT URINALYSIS DIPSTICK
Bilirubin, UA: NEGATIVE
Glucose, UA: NEGATIVE
Ketones, UA: NEGATIVE
Leukocyte Esterase: NEGATIVE
Nitrite, UA: NEGATIVE

## 2010-07-23 MED ORDER — FLUTICASONE PROPIONATE 50 MCG/ACT NA SUSP: 2.0000 | Freq: Every day | NASAL | Status: DC

## 2010-07-23 MED ORDER — LANSOPRAZOLE 30 MG PO CPDR: 30.0000 mg | DELAYED_RELEASE_CAPSULE | Freq: Every day | ORAL | Status: DC

## 2010-07-23 MED ORDER — SIMVASTATIN 40 MG PO TABS: 40.0000 mg | ORAL_TABLET | Freq: Every day | ORAL | Status: DC

## 2010-07-23 MED ORDER — CETIRIZINE HCL 10 MG PO TABS: 10.0000 mg | ORAL_TABLET | Freq: Every day | ORAL | Status: AC

## 2010-07-23 NOTE — Progress Notes (Signed)
  Subjective:    Patient ID: Gavin Parker, male    DOB: November 07, 1965, 45 y.o.   MRN: [id number]  HPI 10 days h/o a ill defined discomfort at the R suprapubic area, worse w/ certain movements ? (squad) No actual bulging Some abd discomfort "gas"  Needs RF  Review of Systems No fever No N-V-D No dysuria or gross hematuria  No scrotal swelling or genital rash  Past Medical History  Diagnosis Date  . 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 Last visit w/ urology June 2011 ([place]); last CT of  the abdomen -chest in [place]:  10/11  . GERD (gastroesophageal reflux disease)   . Hyperlipidemia   . Hypothyroidism   . S/P colonoscopy     ~  03-2008, ok per pt    Past Surgical History  Procedure Date  . Orchiectomy     R  . Hernia repair     stomach  . Tonsillectomy        Objective:   Physical Exam  Constitutional: He appears well-developed and well-nourished.  Abdominal: Soft. Bowel sounds are normal. He exhibits no distension. There is no tenderness. There is no rebound.       No inguinal hernias on either side. At the R suprapubic area there is no mass, warmness, redness. Mild discomfort to palpation          Assessment & Plan:

## 2010-07-23 NOTE — Assessment & Plan Note (Signed)
Ill defined dyscomfort at the R suprapubic area, no mass-hernia , area slt tender to palpation, h/o R testicular cancer, recent CT neg Rec: observation call if sx persist, increase, mass , etc

## 2010-07-30 ENCOUNTER — Encounter: Payer: Self-pay | Admitting: Internal Medicine

## 2010-07-30 ENCOUNTER — Ambulatory Visit (INDEPENDENT_AMBULATORY_CARE_PROVIDER_SITE_OTHER): Payer: BC Managed Care – PPO | Admitting: Internal Medicine

## 2010-07-30 DIAGNOSIS — R109 Unspecified abdominal pain: Secondary | ICD-10-CM

## 2010-07-30 DIAGNOSIS — R197 Diarrhea, unspecified: Secondary | ICD-10-CM | POA: Insufficient documentation

## 2010-07-30 DIAGNOSIS — R1030 Lower abdominal pain, unspecified: Secondary | ICD-10-CM

## 2010-07-30 NOTE — Assessment & Plan Note (Addendum)
Unclear etiology. Muscle skeletal? Hernia? Scar tissue?  rec to see urology (had a R inguinal orchiectomy before) WILL DO THE REFERAL, NEEDS A AM APPOINTMENT

## 2010-07-30 NOTE — Assessment & Plan Note (Signed)
Single episode of diarrhea 2 days ago, some blood , now asx Reports a cscope in [place] ~ 2009...normal Plan: will call if sx resurface

## 2010-07-30 NOTE — Progress Notes (Signed)
  Subjective:    Patient ID: Gavin Parker, male    DOB: 24-Jul-1965, 45 y.o.   MRN: [id number]  HPI Cont. W/ pain at  hte R groin, is slightly worse. Today he reports that certain movements may change the pain. Also 2 days ago he was at work,  have abdominal discomfort, immediately after had a large watery bowel movement. At the end of the bowel movement he saw some bright red blood,  about 1 teaspoon. Since then, the bowel movements are normal. Past Medical History  Diagnosis Date  . 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 Last visit w/ urology June 2011 ([place]); last CT of  the abdomen -chest in [place]:  10/11  . GERD (gastroesophageal reflux disease)   . Hyperlipidemia   . Hypothyroidism   . S/P colonoscopy     ~  03-2008, ok per pt    Past Surgical History  Procedure Date  . Orchiectomy     R  . Hernia repair     stomach  . Tonsillectomy       Review of Systems No fever No rash on the groin. No back pain. No fever or vomiting. No dysuria or gross hematuria    Objective:   Physical Exam Alert oriented in on apparent distress. Abdomen is soft, nontender, no mass. Very mild tenderness on the right side of the suprapubic area without any other abnormality. No inguinal hernia, mass or lymphadenopathy that I can tell. GU : digital rectal exam normal, brown stools, Hemoccult negative. Single testicle on the left normal to palpation. Penis without lesions   .       Assessment & Plan:

## 2010-07-31 ENCOUNTER — Other Ambulatory Visit: Payer: Self-pay | Admitting: Internal Medicine

## 2010-10-01 ENCOUNTER — Encounter: Payer: Self-pay | Admitting: Internal Medicine

## 2010-10-01 ENCOUNTER — Ambulatory Visit (INDEPENDENT_AMBULATORY_CARE_PROVIDER_SITE_OTHER): Payer: BC Managed Care – PPO | Admitting: Internal Medicine

## 2010-10-01 DIAGNOSIS — J309 Allergic rhinitis, unspecified: Secondary | ICD-10-CM | POA: Insufficient documentation

## 2010-10-01 MED ORDER — AZELASTINE HCL 0.15 % NA SOLN: NASAL | Status: DC

## 2010-10-01 NOTE — Assessment & Plan Note (Addendum)
Sx c/w allergies, reports good compliance w/ zyrtec-flonase Add astepro See instructions

## 2010-10-01 NOTE — Patient Instructions (Signed)
Add astepro Call if no better in few days or if fever mucinex DM as needed for coug-congestion

## 2010-10-01 NOTE — Progress Notes (Signed)
  Subjective:    Patient ID: Gavin Parker, male    DOB: September 11, 1965, 45 y.o.   MRN: [id number]  HPI 4 days history of throat itching, sinus pressure, ear pressure and mild cough Good compliance with Zyrtec and Flonase  Past Medical History  Diagnosis Date  . 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 Last visit w/ urology June 2011 ([place]); last CT of  the abdomen -chest in [place]:  10/11  . GERD (gastroesophageal reflux disease)   . Hyperlipidemia   . Hypothyroidism   . S/P colonoscopy     ~  03-2008, ok per pt     Past Surgical History  Procedure Date  . Orchiectomy     R  . Hernia repair     stomach  . Tonsillectomy     Review of Systems Denies fevers No sputum production but has noted postnasal dripping. No recent No itchy eyes or itchy nose No GERD symptoms.    Objective:   Physical Exam Alert, oriented x3 Ears: Wax on the right side but normal tympanic membranes bilaterally Nose: Slightly congested Oropharynx: No redness or discharge Lungs: Clear to auscultation bilaterally       Assessment & Plan:

## 2010-10-14 ENCOUNTER — Ambulatory Visit (INDEPENDENT_AMBULATORY_CARE_PROVIDER_SITE_OTHER): Payer: BC Managed Care – PPO | Admitting: Internal Medicine

## 2010-10-14 ENCOUNTER — Encounter: Payer: Self-pay | Admitting: Internal Medicine

## 2010-10-14 DIAGNOSIS — J019 Acute sinusitis, unspecified: Secondary | ICD-10-CM

## 2010-10-14 DIAGNOSIS — L989 Disorder of the skin and subcutaneous tissue, unspecified: Secondary | ICD-10-CM | POA: Insufficient documentation

## 2010-10-14 DIAGNOSIS — J309 Allergic rhinitis, unspecified: Secondary | ICD-10-CM

## 2010-10-14 MED ORDER — AMOXICILLIN-POT CLAVULANATE ER 1000-62.5 MG PO TB12: 1.0000 | ORAL_TABLET | Freq: Two times a day (BID) | ORAL | Status: AC

## 2010-10-14 MED ORDER — PREDNISONE 10 MG PO TABS: ORAL_TABLET | ORAL | Status: AC

## 2010-10-14 NOTE — Assessment & Plan Note (Signed)
Patient presents with a headache in the setting of increased sinus congestion; likely sinusitis. See instructions

## 2010-10-14 NOTE — Assessment & Plan Note (Addendum)
Discontinue astepro, it didn't help much

## 2010-10-14 NOTE — Assessment & Plan Note (Signed)
Has  noted as slightly itchy, new skin lesions at the distal left lower extremity, see physical exam. Since is a new lesion, I recommend excision. Patient will schedule

## 2010-10-14 NOTE — Patient Instructions (Addendum)
Rest, fluids , tylenol or motrin Take the antibiotic as prescribed...Marland KitchenMarland KitchenAugmentin Prednisone for 5 days  Call if no better in few days Call anytime if the symptoms are severe, you have high fever, increase headache stop astepro for now

## 2010-10-14 NOTE — Progress Notes (Signed)
  Subjective:    Patient ID: Gavin Parker, male    DOB: 08-24-1965, 45 y.o.   MRN: [id number]  HPI Was seen recently w/ allergies, astepro added, no better, actually worse: ++ sinus congestion, HA (points to the forehead, pain  moving to the top and sides of the head); patient remembers having similar symptoms in the past, was treated  for sinusitis and improved. Past Medical History  Diagnosis Date  . 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 Last visit w/ urology June 2011 ([place]); last CT of  the abdomen -chest in [place]:  10/11  . GERD (gastroesophageal reflux disease)   . Hyperlipidemia   . Hypothyroidism   . S/P colonoscopy     ~  03-2008, ok per pt      Review of Systems No fever or chills No cough No postnasal dripping No nausea or vomiting. No photophobia.    Objective:   Physical Exam  Constitutional: He is oriented to person, place, and time. He appears well-developed and well-nourished.  HENT:  Head: Normocephalic and atraumatic.       Slightly tender to palpation of the frontal sinuses, not at the maxillary sinuses.  Eyes: EOM are normal. Pupils are equal, round, and reactive to light.  Cardiovascular: Normal rate, regular rhythm and normal heart sounds.   Pulmonary/Chest: Effort normal and breath sounds normal. No respiratory distress. He has no wheezes. He has no rales.  Musculoskeletal:       Feet:  Neurological: He is alert and oriented to person, place, and time. No cranial nerve deficit.          Assessment & Plan:

## 2010-10-22 ENCOUNTER — Emergency Department (HOSPITAL_COMMUNITY): Payer: BC Managed Care – PPO

## 2010-10-22 ENCOUNTER — Inpatient Hospital Stay (HOSPITAL_COMMUNITY)
Admission: EM | Admit: 2010-10-22 | Discharge: 2010-10-24 | DRG: 125 | Disposition: A | Discharge status: Home / Self Care | Payer: BC Managed Care – PPO | Attending: Internal Medicine | Admitting: Internal Medicine

## 2010-10-22 ENCOUNTER — Telehealth: Payer: Self-pay | Admitting: *Deleted

## 2010-10-22 DIAGNOSIS — Z8547 Personal history of malignant neoplasm of testis: Secondary | ICD-10-CM

## 2010-10-22 DIAGNOSIS — Z7982 Long term (current) use of aspirin: Secondary | ICD-10-CM

## 2010-10-22 DIAGNOSIS — Z800 Family history of malignant neoplasm of digestive organs: Secondary | ICD-10-CM

## 2010-10-22 DIAGNOSIS — Z87891 Personal history of nicotine dependence: Secondary | ICD-10-CM

## 2010-10-22 DIAGNOSIS — I2510 Atherosclerotic heart disease of native coronary artery without angina pectoris: Principal | ICD-10-CM | POA: Diagnosis present

## 2010-10-22 DIAGNOSIS — Z8041 Family history of malignant neoplasm of ovary: Secondary | ICD-10-CM

## 2010-10-22 DIAGNOSIS — K219 Gastro-esophageal reflux disease without esophagitis: Secondary | ICD-10-CM | POA: Diagnosis present

## 2010-10-22 DIAGNOSIS — J301 Allergic rhinitis due to pollen: Secondary | ICD-10-CM | POA: Diagnosis present

## 2010-10-22 DIAGNOSIS — E039 Hypothyroidism, unspecified: Secondary | ICD-10-CM | POA: Diagnosis present

## 2010-10-22 DIAGNOSIS — E785 Hyperlipidemia, unspecified: Secondary | ICD-10-CM | POA: Diagnosis present

## 2010-10-22 LAB — BASIC METABOLIC PANEL
BUN: 16 mg/dL (ref 6–23)
Chloride: 101 mEq/L (ref 96–112)
Creatinine, Ser: 1.06 mg/dL (ref 0.50–1.35)
GFR calc Af Amer: >60 mL/min (ref >60)
GFR calc non Af Amer: >60 mL/min (ref >60)

## 2010-10-22 LAB — TROPONIN I: Troponin I: <0.3 ng/mL (ref <0.30)

## 2010-10-22 LAB — CBC
HCT: 42.1 % (ref 39.0–52.0)
MCV: 83 fL (ref 78.0–100.0)
Platelets: 188 10*3/uL (ref 150–400)
RBC: 5.07 MIL/uL (ref 4.22–5.81)
WBC: 9.2 10*3/uL (ref 4.0–10.5)

## 2010-10-22 LAB — DIFFERENTIAL
Basophils Absolute: 0 10*3/uL (ref 0.0–0.1)
Eosinophils Absolute: 0.3 10*3/uL (ref 0.0–0.7)
Lymphocytes Relative: 23 % (ref 12–46)
Lymphs Abs: 2.2 10*3/uL (ref 0.7–4.0)
Neutrophils Relative %: 66 % (ref 43–77)

## 2010-10-22 LAB — CK TOTAL AND CKMB (NOT AT ARMC)
CK, MB: 4.9 ng/mL — ABNORMAL HIGH (ref 0.3–4.0)
Relative Index: 1.5 (ref 0.0–2.5)

## 2010-10-22 NOTE — Telephone Encounter (Signed)
I spoke w/ pt he states that his HA has eased off, its mostly in the am. Pt states that he is having off and on L arm pain, towards his wrist sometimes in muscle. Pt denies any CP or SOB. Pt states he will go th ER if symptoms reoccur.

## 2010-10-22 NOTE — Telephone Encounter (Signed)
Message copied by Leanne Lovely on Tue Oct 22, 2010 10:31 AM ------      Message from: Leanne Lovely      Created: Tue Oct 15, 2010  7:36 AM       Check to see if HA is better.

## 2010-10-22 NOTE — Telephone Encounter (Signed)
Noted, has a f/u in 3 days with me

## 2010-10-23 ENCOUNTER — Observation Stay (HOSPITAL_COMMUNITY): Payer: BC Managed Care – PPO

## 2010-10-23 DIAGNOSIS — R079 Chest pain, unspecified: Secondary | ICD-10-CM

## 2010-10-23 DIAGNOSIS — I2510 Atherosclerotic heart disease of native coronary artery without angina pectoris: Secondary | ICD-10-CM

## 2010-10-23 LAB — TROPONIN I: Troponin I: <0.3 ng/mL (ref <0.30)

## 2010-10-23 LAB — CARDIAC PANEL(CRET KIN+CKTOT+MB+TROPI)
Relative Index: 2.2 (ref 0.0–2.5)
Total CK: 125 U/L (ref 7–232)
Troponin I: <0.3 ng/mL (ref <0.30)

## 2010-10-23 LAB — HEMOGLOBIN A1C: Mean Plasma Glucose: 111 mg/dL (ref <117)

## 2010-10-23 LAB — CK TOTAL AND CKMB (NOT AT ARMC)
CK, MB: 3.7 ng/mL (ref 0.3–4.0)
CK, MB: 3.2 ng/mL (ref 0.3–4.0)
Relative Index: 2.7 — ABNORMAL HIGH (ref 0.0–2.5)
Total CK: 268 U/L — ABNORMAL HIGH (ref 7–232)

## 2010-10-23 LAB — APTT: aPTT: 31 seconds (ref 24–37)

## 2010-10-23 LAB — PROTIME-INR
INR: 1 (ref 0.00–1.49)
Prothrombin Time: 13.4 seconds (ref 11.6–15.2)

## 2010-10-23 MED ORDER — IOHEXOL 350 MG/ML SOLN
80.0000 mL | Freq: Once | INTRAVENOUS | Status: AC | PRN
  Filled 2010-10-23: qty 100

## 2010-10-24 LAB — CBC
Hemoglobin: 13.8 g/dL (ref 13.0–17.0)
MCHC: 35 g/dL (ref 30.0–36.0)
RDW: 13.2 % (ref 11.5–15.5)
WBC: 6.9 10*3/uL (ref 4.0–10.5)

## 2010-10-24 LAB — BASIC METABOLIC PANEL
Chloride: 104 mEq/L (ref 96–112)
GFR calc Af Amer: >60 mL/min (ref >60)
GFR calc non Af Amer: >60 mL/min (ref >60)
Potassium: 4.1 mEq/L (ref 3.5–5.1)
Sodium: 138 mEq/L (ref 135–145)

## 2010-10-24 LAB — LIPID PANEL
HDL: 32 mg/dL — ABNORMAL LOW (ref >39)
Total CHOL/HDL Ratio: 4.1 RATIO
Triglycerides: 138 mg/dL (ref <150)

## 2010-10-25 ENCOUNTER — Telehealth: Payer: Self-pay | Admitting: Cardiology

## 2010-10-25 ENCOUNTER — Ambulatory Visit (INDEPENDENT_AMBULATORY_CARE_PROVIDER_SITE_OTHER): Payer: BC Managed Care – PPO | Admitting: Internal Medicine

## 2010-10-25 ENCOUNTER — Encounter: Payer: Self-pay | Admitting: Internal Medicine

## 2010-10-25 DIAGNOSIS — I2510 Atherosclerotic heart disease of native coronary artery without angina pectoris: Secondary | ICD-10-CM

## 2010-10-25 DIAGNOSIS — E785 Hyperlipidemia, unspecified: Secondary | ICD-10-CM

## 2010-10-25 NOTE — Telephone Encounter (Signed)
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

## 2010-10-25 NOTE — Progress Notes (Signed)
  Subjective:    Patient ID: Gavin Parker, male    DOB: 1965/11/25, 45 y.o.   MRN: [id number]  HPI Hospital followup, was admitted for 2 days and discharged 7-26. Initial presentation was chest pain, he ruled out for MI, had a diagnostic cardiac catheterization that showed nonobstructive CAD, was recommended medical therapy. Relevant  labs and x-rays: He did have CT of the heart with a question of a stenosis of the LAD, 70%. Cardiac catheterization showed 20% to 40% stenosis @ the LAD Chest x-ray negative TSH, CBC, BMP normal Hemoglobin A1c 5.5 Total cholesterol 132, HDL 52, LDL 72.  Past Medical History  Diagnosis Date  . 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 Last visit w/ urology June 2011 ([place]); last CT of  the abdomen -chest in [place]:  10/11  . GERD (gastroesophageal reflux disease)   . Hyperlipidemia   . Hypothyroidism   . S/P colonoscopy     ~  03-2008, ok per pt   . CAD (coronary artery disease)     non obstructive per cath 09-2010   Past Surgical History  Procedure Date  . Orchiectomy     R  . Hernia repair     stomach  . Tonsillectomy      Review of Systems Patient was seen recently with headache and sinusitis. He developed an ill-defined left arm pain 7-21, and on 7-23 he had a brief ill-defined chest pain. On 7-24 he was working, got a throbbing pain in the left upper chest and again an ill-defined left arm pain. The pain lasted less than 1 minute, on and off, not exertional. He decided to go to the ER and was admitted. See above Now the pain in the chest is "almost gone" . The headache is back, he just restarted his Flonase and augmentin  Today. Think HA was better while on augmentin-flonase      Objective:   Physical Exam  Constitutional: He is oriented to person, place, and time. He appears well-developed.  HENT:  Head: Normocephalic and atraumatic.  Cardiovascular: Normal rate, regular rhythm and normal heart sounds.   No  murmur heard. Pulmonary/Chest: Effort normal and breath sounds normal. No respiratory distress. He has no wheezes. He has no rales.  Musculoskeletal: He exhibits no edema.       Right upper extremity with a gause -tape from the cardiac catheterization, no obvious swelling or redness  Neurological: He is alert and oriented to person, place, and time.  Psychiatric: He has a normal mood and affect. His behavior is normal. Judgment and thought content normal.          Assessment & Plan:   CAD in native artery Developed atypical chest pain, he rule out for MI, cardiac catheterization showed Non-obstructed CAD (40% blockage) Plan:  Medical treatment Followup with cardiology His last cholesterol is  actually very good. See "hyperlipidemia"   HYPERLIPIDEMIA Found to have nonobstructed CAD, FLP is pretty good. In light of the 40% blockage, seems like we need to get him under even better control. Plan: Continue his Zocor for now, follow up in 2 months, we'll check NMR to further assess his risk unless  cardiology has another suggestion (switch to Lipitor now?)

## 2010-10-25 NOTE — Assessment & Plan Note (Addendum)
Found to have nonobstructed CAD, FLP is pretty good. In light of the 40% blockage, seems like we need to get him under even better control. Plan: Continue his Zocor for now, follow up in 2 months, we'll check NMR to further assess his risk unless  cardiology has another suggestion (switch to Lipitor now?)

## 2010-10-25 NOTE — Assessment & Plan Note (Signed)
Developed atypical chest pain, he rule out for MI, cardiac catheterization showed Non-obstructed CAD (40% blockage) Plan:  Medical treatment Followup with cardiology His last cholesterol is  actually very good. See "hyperlipidemia"

## 2010-10-25 NOTE — Telephone Encounter (Signed)
Per pt call he was d/c from hospital and given a note to rtn to work on 8/1 but when he called his work he was told he should try to get a note to rtn on 8/6 and then it would be classified as medical and it would secure his job other wise he may be in danger of loosing his job or be in corrective action of some sort. Pt really needs a call back today so he can notify his work of what is going on.

## 2010-10-26 ENCOUNTER — Encounter: Payer: Self-pay | Admitting: Internal Medicine

## 2010-10-31 NOTE — Cardiovascular Report (Signed)
NAMELEVII, HAIRFIELD NO.:  [id number]  MEDICAL RECORD NO.:  [id number]  LOCATION:  6526                         FACILITY:  [facility]  PHYSICIAN:  Lorine Bears, MD     DATE OF BIRTH:  03-10-1966  DATE OF PROCEDURE: DATE OF DISCHARGE:                           CARDIAC CATHETERIZATION   PROCEDURES PERFORMED: 1. Left heart catheterization 2. Coronary angiography. 3. Left ventricular angiography.  INDICATIONS AND CLINICAL HISTORY:  This is a 45 year old gentleman without previous cardiac history.  He presented with symptoms of chest pain.  He ruled out for myocardial infarction.  He had a CTA of the coronary arteries which was suggestive of more than 70% stenosis in the right coronary artery as well as the left anterior descending artery. Due to these findings, cardiac catheterization was recommended.  Risks, benefits and alternatives were discussed with the patient.  ACCESS:  Right radial artery.  STUDY DETAILS:  A standard informed consent was obtained.  The right radial area was prepped in a sterile fashion.  It was anesthetized with 1% lidocaine.  A 5-French sheath was placed in the right radial artery after anterior puncture.  A 3 mg of verapamil was given through the sheath, 5000 units of unfractionated heparin was given intravenously. Coronary angiography was performed with a Annice Pih catheter which was also used to cross the aortic valve and perform left ventricular angiography with hand injection.  The catheter was removed over a wire.  The sheath was removed and a TR band was applied.  There were no immediate complications.  STUDY FINDINGS:  Hemodynamic findings:  Central aortic pressure is 114/78 with a mean pressure of 96 mmHg.  Left ventricular pressure is 112/9 with a left ventricular end-diastolic pressure of 12 mmHg.  Left ventricular angiography:  This showed normal LV systolic function with an estimated ejection fraction of 60%.  CORONARY  ANGIOGRAPHY:  Left main coronary artery:  The vessel is normal in size and free of significant disease. Left circumflex artery:  The vessel is normal in size and nondominant. It has no significant coronary artery disease.  There is a small size ramus which is free of significant disease. Left anterior descending artery:  The vessel is normal in size with mild plaque formation.  In the mid segment after the first septal perforator, there is a 20% tubular stenosis.  Right after second diagonal there is a 40% eccentric lesion.  In the distal LAD, there is 20% discrete lesion. No evidence of obstructive disease in the left anterior descending artery:  First diagonal is small in size.  Second diagonal is normal in size and free of significant disease.  Third diagonal is very small in size.  Right coronary artery:  The vessel is normal in size and dominant. There is plaque formation in the proximal segment with 40% stenosis. The rest of the right coronary artery is free of significant disease.  STUDY CONCLUSION: 1. Mild-to-moderate left anterior descending artery and right coronary     artery disease without any evidence of obstructive disease. 2. Normal LV systolic function and normal LV left ventricular end-     diastolic pressure.  RECOMMENDATIONS:  Aggressive medical therapy and lifestyle modification is recommended.  Lorine Bears, MD     MA/MEDQ  D:  10/23/2010  T:  10/24/2010  Job:  [id number]  Electronically Signed by Lorine Bears MD on 10/31/2010 02:26:34 PM

## 2010-11-04 NOTE — Discharge Summary (Signed)
Gavin Parker, Gavin Parker NO.:  [id number]  MEDICAL RECORD NO.:  [id number]  LOCATION:  6526                         FACILITY:  [facility]  PHYSICIAN:  Madolyn Frieze. Jens Som, MD, FACCDATE OF BIRTH:  June 10, 1965  DATE OF ADMISSION:  10/22/2010 DATE OF DISCHARGE:  10/24/2010                              DISCHARGE SUMMARY   PRIMARY CARDIOLOGIST:  Madolyn Frieze. Jens Som, MD, [facility]  PRIMARY CARE PROVIDER:  Willow Ora, MD  DISCHARGE DIAGNOSIS:  Chest pain.  SECONDARY DIAGNOSES: 1. Nonobstructive coronary artery disease. 2. Gastroesophageal reflux disease. 3. Hyperlipidemia. 4. Hypothyroidism. 5. History of testicular cancer status post right orchiectomy in 2009. 6. History of seasonal allergies. 7. Remote tobacco abuse.  ALLERGIES:  No known drug allergies.  PROCEDURES: 1. Cardiac CT angiography performed on October 23, 2010, suggesting     hemodynamically significant stenoses of the mid and distal LAD as     well as the proximal right coronary artery.  Calcium score 289. 2. Left heart cardiac catheterization performed on October 23, 2010,     showing 20% proximal stenosis in the LAD with 40% mid stenosis and     20% distal stenosis.  Normal left circumflex.  40% proximal     stenosis in the right coronary artery.  Normal LV systolic function     with an EF of approximately 60%.  Medical therapy recommended.  HISTORY OF PRESENT ILLNESS:  A 45 year old male without prior cardiac history who was in his usual state of health until earlier this week when he was walking, had sudden onset of substernal chest discomfort that improved with rest.  He had recurrence of symptoms on October 22, 2010, prompting him to call EMS.  He was taken to the [facility] ED.  In the ED, his troponins were negative and he had mild elevation of his CK- MB to 4.9.  He was then listed in the chest pain protocol and subsequently underwent cardiac CT angiography which suggested significant LAD and RCA disease.  As  a result of this finding, Cardiology was called for admission.  HOSPITAL COURSE:  The patient ruled out for MI.  He underwent diagnostic catheterization on October 23, 2010, revealing nonobstructive CAD as outlined above.  Medical therapy has been recommended and the patient will discharged home today in good condition.  DISCHARGE LABORATORY DATA:  Hemoglobin 13.8, hematocrit 39.4, WBC 6.9, platelets 169.  Sodium 138, potassium 4.1, chloride 104, CO2 of 27, BUN 13, creatinine 0.99, glucose 102, calcium 9.1.  Hemoglobin A1c 5.5.  CK 125, MB 2.7, troponin I less than 0.30, total cholesterol 32, triglycerides 138, HDL 32, LDL 72.  DISPOSITION:  The patient will be discharged home today in good condition.  FOLLOWUP PLANS AND APPOINTMENTS:  The patient will follow up with Dr. Willow Ora as previously scheduled.  He had an appointment to see Dr. Jens Som at the [facility] on November 27, 2010, at 10:30 a.m.  DISCHARGE MEDICATIONS: 1. Aspirin 81 mg daily. 2. Augmentin XR 1000/62.5 mg b.i.d. as previously prescribed. 3. Lansoprazole 30 mg daily. 4. Levothyroxine 100 mcg daily. 5. Simvastatin 40 mg at bedtime. 6. Zyrtec 10 mg daily.  OUTSTANDING LABS AND STUDIES:  None.  DURATION OF DISCHARGE ENCOUNTER:  Thirty five minutes including physician time.     Nicolasa Ducking, ANP   ______________________________ Madolyn Frieze. Jens Som, MD, [facility]    CB/MEDQ  D:  10/24/2010  T:  10/24/2010  Job:  [id number]  cc:   Willow Ora, MD  Electronically Signed by Nicolasa Ducking ANP on 10/29/2010 07:26:43 PM Electronically Signed by Olga Millers MD [facility] on 11/04/2010 03:54:18 PM

## 2010-11-08 NOTE — H&P (Signed)
NAMEABDELAZIZ, WESTENBERGER NO.:  [id number]  MEDICAL RECORD NO.:  [id number]  LOCATION:                                 FACILITY:  PHYSICIAN:  Doylene Canning. Ladona Ridgel, MD    DATE OF BIRTH:  1965/06/16  DATE OF ADMISSION:  10/23/2010 DATE OF DISCHARGE:                             HISTORY & PHYSICAL   ADMISSION DIAGNOSIS:  Chest pain.  HISTORY OF PRESENT ILLNESS:  The patient is a 45 year old man who is in his usual state of health several days ago when he was walking up a hill at the [facility].  The patient noted left arm and left chest discomfort while he was walking which resolved with rest.  The patient did not seek medical attention initially.  Yesterday, he noted more shortness of breath and EMS was called.  He is currently chest pain- free.  His initial cardiac markers demonstrated slightly elevated cardiac MB fractions but normal troponins.  He underwent cardiac CT scan which demonstrated apparent significant disease in the LAD and RCA distributions and he is admitted for additional evaluation.  The patient denies any other major medical problems ongoing.  PAST MEDICAL HISTORY:  Notable for remote testicular cancer status post orchiectomy.  He has a history of acid reflux, on medication.  He has a history of dyslipidemia, on therapy.  He was recently diagnosed with sinusitis and has been on antibiotics.  SOCIAL HISTORY:  The patient recently moved from [place] and living in [place].  He is married.  He has three children and one stepchild.  He has a history of tobacco use but stopped smoking many years ago.  He has occasional alcohol use.  FAMILY HISTORY:  Notable for mother dying of ovarian cancer.  Father died of pancreatic cancer.  His siblings are in good health.  REVIEW OF SYSTEMS:  All systems reviewed and negative except as noted in the HPI with the exception of some ongoing gastric reflux.  PHYSICAL EXAMINATION:  GENERAL:  He is a pleasant  well-appearing young man in no distress. VITAL SIGNS:  Blood pressure is 100/60, the pulse is 60 and regular, respirations are 18, and temperature 98. HEENT:  Normocephalic and atraumatic.  Pupils equal and round. Oropharynx is moist.  Sclerae anicteric. NECK:  No jugular venous distention.  There is no thyromegaly. LUNGS:  Clear bilaterally to auscultation.  No wheezes, rales, or rhonchi are present. CARDIAC:  Regular bradycardia with normal S1-S2. ABDOMEN:  Soft, nontender, and nondistended.  No organomegaly. EXTREMITIES:  No cyanosis, clubbing, or edema.  Pulses are 2+ and symmetric. NEUROLOGIC:  Alert and oriented x3.  Cranial nerves intact.  Strength is 5/5 and symmetric.  IMAGING:  EKG demonstrates sinus bradycardia with very small inferior Qs, nonspecific ST-T wave abnormality.  Chest x-ray demonstrates a left lower lobe scar versus segmental atelectasis.  LABORATORY DATA:  Of note, include slightly elevated CK-MB band of 5 with normal troponins.  Creatinine is normal.  Hemoglobin is 14.  IMPRESSION: 1. Unstable angina with coronary CT scan suggesting very high     likelihood of at least two-vessel coronary artery disease. 2. History of testicular cancer status post treatment. 3. Dyslipidemia. 4.  Tobacco abuse, stopped years ago.  DISCUSSION:  I have recommended proceeding with left heart catheterization to define the patient's coronary anatomy and for treatment purposes.  This will be scheduled at earliest possible convenient time.  Additional recommendations will be based on the results of catheterization.     Doylene Canning. Ladona Ridgel, MD     GWT/MEDQ  D:  10/23/2010  T:  10/23/2010  Job:  [id number]  cc:   Willow Ora, MD  Electronically Signed by Lewayne Bunting MD on 11/08/2010 09:52:38 AM

## 2010-11-13 ENCOUNTER — Ambulatory Visit: Payer: BC Managed Care – PPO | Admitting: Internal Medicine

## 2010-11-14 ENCOUNTER — Ambulatory Visit (INDEPENDENT_AMBULATORY_CARE_PROVIDER_SITE_OTHER): Payer: BC Managed Care – PPO | Admitting: Family Medicine

## 2010-11-14 ENCOUNTER — Encounter: Payer: Self-pay | Admitting: Family Medicine

## 2010-11-14 DIAGNOSIS — L819 Disorder of pigmentation, unspecified: Secondary | ICD-10-CM | POA: Insufficient documentation

## 2010-11-14 DIAGNOSIS — L989 Disorder of the skin and subcutaneous tissue, unspecified: Secondary | ICD-10-CM

## 2010-11-14 DIAGNOSIS — J309 Allergic rhinitis, unspecified: Secondary | ICD-10-CM

## 2010-11-14 NOTE — Progress Notes (Signed)
  Subjective:    Patient ID: Gavin Parker, male    DOB: Feb 09, 1966, 45 y.o.   MRN: [id number]  HPI Skin growth- small area on L lower leg, not dark or changing, not itchy.  Was told be previous MD it needed to be removed, wants 2nd opinion.  Hypopigmentation- multiple spots on neck, previously had cortisone injxns to improve hair growth in that area.  Pt reports the white spots have recently returned.  Nose bleed- started 3 days ago.  Occurs w/ blowing nose.  Stopped Flonase.  Has hx of similar.   Review of Systems For ROS see HPI     Objective:   Physical Exam  Vitals reviewed. Constitutional: He appears well-developed and well-nourished. No distress.  HENT:  Nose: Mucosal edema present.       1 cm scab on nasal septum of R nostril  Skin: Skin is warm and dry.       0.3 cm elevated mole on L lower leg w/out concerning features. Hypopigmentation on R jaw line w/out hair follicles present          Assessment & Plan:

## 2010-11-14 NOTE — Patient Instructions (Signed)
Follow up in January to recheck cholesterol- don't eat before this appt Your mole is fine- you can keep it! We'll notify you of your derm appt The nosebleeds are due to a small scab in your nose- this should heal on its own Call with any questions or concerns Hang in there!!!

## 2010-11-14 NOTE — Assessment & Plan Note (Signed)
Pt w/ hx of similar.  Required cortisone injxns in the past.  Will refer to derm.

## 2010-11-14 NOTE — Assessment & Plan Note (Signed)
No concerning features- no need for removal at this time.

## 2010-11-14 NOTE — Assessment & Plan Note (Signed)
Pt stopped Flonase due to recent bleeding w/ blowing nose.  Small scab visible on L septum.  This is likely due to steroid induced ulceration.  Pt will hold nasal steroid for time being.  To call if sxs worsen.

## 2010-11-15 ENCOUNTER — Other Ambulatory Visit: Payer: Self-pay | Admitting: *Deleted

## 2010-11-21 ENCOUNTER — Telehealth: Payer: Self-pay | Admitting: Internal Medicine

## 2010-11-21 NOTE — Telephone Encounter (Signed)
I discussed his low HDL informally with cardiology, a good option is niaspan. Advise patient: Recommend

## 2010-11-21 NOTE — Telephone Encounter (Signed)
Recommend: niaspan 500 mg 1 qhs , take 20 min after a snack  After 1 week, take 2 tablets together Provide a Rx,  samples and information , there is a hotline he can call to get more info AST ALT FLP in 2 months

## 2010-11-22 ENCOUNTER — Ambulatory Visit (INDEPENDENT_AMBULATORY_CARE_PROVIDER_SITE_OTHER): Payer: BC Managed Care – PPO | Admitting: Internal Medicine

## 2010-11-22 DIAGNOSIS — J310 Chronic rhinitis: Secondary | ICD-10-CM

## 2010-11-22 DIAGNOSIS — R040 Epistaxis: Secondary | ICD-10-CM

## 2010-11-22 MED ORDER — MUPIROCIN 2 % EX OINT: TOPICAL_OINTMENT | CUTANEOUS | Status: DC

## 2010-11-22 MED ORDER — FLUTICASONE PROPIONATE 50 MCG/ACT NA SUSP: 1.0000 | NASAL | Status: DC

## 2010-11-22 NOTE — Patient Instructions (Signed)
Plain Mucinex for thick secretions ;force NON dairy fluids for next 48-72  hrs. Use a Neti pot daily as needed for sinus congestion . Flonase 1 spray in each nostril twice a day as needed. Use the "crossover" technique as discussed

## 2010-11-22 NOTE — Progress Notes (Signed)
  Subjective:    Patient ID: Gavin Parker, male    DOB: [birth year], 45 y.o.   MRN: [id number]  HPI he has had frank epistaxis when he blows his nose for a week and a half. He has some normal nasal secretions from the nose but no significant purulence. He said some mild frontal headaches as well. He denies hemoptysis,  hematuria, melena, rectal bleeding, abnormal bruising or bleeding.  He was recently treated for rhinosinusitis with Augmentin and Flonase. He has stopped the Flonase, concerned that it might be contributing to the bleeding.  He is on 81 mg of aspirin, he takes no other anticoagulants.   His past medical history of recurrent epistaxis as a child related to minor trauma. Apparently had cautery on several occasions   Review of Systems     Objective:   Physical Exam General appearance is of good health and nourishment; no acute distress or increased work of breathing is present.  No  lymphadenopathy about the head, neck, or axilla noted.   Eyes: No conjunctival inflammation or lid edema is present.  Ears:  External ear exam shows no significant lesions or deformities.  Otoscopic examination reveals clear canals, tympanic membranes are intact bilaterally without bulging, retraction, inflammation or discharge.  Nose:  External nasal examination shows no deformity or inflammation. There is a collection of blood and early eschar on the right nasal septum. The septum is slightly deviated to the left with some decrease nasal airflow, left greater than right.  Oral exam: Dental hygiene is good; lips and gums are healthy appearing.There is no oropharyngeal erythema or exudate noted.   Heart:  Normal rate and regular rhythm. S1 and S2 normal without gallop, murmur, click, rub or other extra sounds. S4 with slurring   Lungs:Chest clear to auscultation; no wheezes, rhonchi,rales ,or rubs present.No increased work of breathing.    Extremities:  No cyanosis, edema, or clubbing  noted     Skin: Warm & dry           Assessment & Plan:  #1 epistaxis from a lesion on the right septum  #2 recent rhinosinusitis  Plan: See orders and recommendations.  ENT referral for the cautery would be necessary if epistaxis persists despite these interventions.

## 2010-11-25 NOTE — Telephone Encounter (Signed)
Left msg for pt to return call.

## 2010-11-27 ENCOUNTER — Encounter: Payer: BC Managed Care – PPO | Admitting: Cardiology

## 2010-11-29 ENCOUNTER — Telehealth: Payer: Self-pay | Admitting: Family Medicine

## 2010-11-29 DIAGNOSIS — J310 Chronic rhinitis: Secondary | ICD-10-CM

## 2010-11-29 DIAGNOSIS — R040 Epistaxis: Secondary | ICD-10-CM

## 2010-11-29 NOTE — Telephone Encounter (Signed)
Patient was seen by dr hopper [phone] - he tried using neti pot but nose is so congested it womt drain he as clear mucous with blood  & headache - he said dr hopper had mentioned referral to ent

## 2010-11-29 NOTE — Telephone Encounter (Signed)
Addended by: Doristine Devoid on: 11/29/2010 10:16 AM   Modules accepted: Orders

## 2010-11-29 NOTE — Telephone Encounter (Signed)
Spoke w/ pt aware of referral

## 2010-12-05 ENCOUNTER — Ambulatory Visit (INDEPENDENT_AMBULATORY_CARE_PROVIDER_SITE_OTHER): Payer: BC Managed Care – PPO | Admitting: Cardiology

## 2010-12-05 ENCOUNTER — Encounter: Payer: Self-pay | Admitting: Cardiology

## 2010-12-05 DIAGNOSIS — E785 Hyperlipidemia, unspecified: Secondary | ICD-10-CM

## 2010-12-05 DIAGNOSIS — I2510 Atherosclerotic heart disease of native coronary artery without angina pectoris: Secondary | ICD-10-CM

## 2010-12-05 NOTE — Assessment & Plan Note (Signed)
Continue aspirin and statin. Discussed risk modification.

## 2010-12-05 NOTE — Patient Instructions (Signed)
Your physician wants you to follow-up in: ONE YEAR You will receive a reminder letter in the mail two months in advance. If you don't receive a letter, please call our office to schedule the follow-up appointment.  

## 2010-12-05 NOTE — Assessment & Plan Note (Signed)
Continue statin. 

## 2010-12-05 NOTE — Progress Notes (Signed)
HPI: 45 year old male recently admitted to [facility] after having chest pain and an abnormal cardiac CTA suggesting significant coronary disease. Cardiac catheterization was performed in July of 2012 and revealed normal LM; normal Lcx; 20% mid LAD followed by a 40% lesion; 20 % distal LAD; 40 % proximal RCA; normal LV function. Since DC, the patient denies any dyspnea on exertion, orthopnea, PND, pedal edema, palpitations, syncope or chest pain.   Current Outpatient Prescriptions  Medication Sig Dispense Refill  . aspirin 81 MG tablet Take 81 mg by mouth daily.        . cetirizine (ZYRTEC) 10 MG tablet Take 1 tablet (10 mg total) by mouth daily.  30 tablet  6  . fluticasone (FLONASE) 50 MCG/ACT nasal spray Place 1 spray into the nose as directed. 1 spray in each nostril twice a day as needed. Use the "crossover" technique as discussed   16 g  2  . lansoprazole (PREVACID) 30 MG capsule Take 1 capsule (30 mg total) by mouth daily.  30 capsule  6  . levothyroxine (SYNTHROID, LEVOTHROID) 100 MCG tablet TAKE ONE TABLET BY MOUTH EVERY DAY  90 tablet  1  . simvastatin (ZOCOR) 40 MG tablet Take 1 tablet (40 mg total) by mouth at bedtime.  30 tablet  6     Past Medical History  Diagnosis Date  . 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 Last visit w/ urology June 2011 ([place]); last CT of  the abdomen -chest in [place]:  10/11  . GERD (gastroesophageal reflux disease)   . Hyperlipidemia   . Hypothyroidism   . S/P colonoscopy     ~  03-2008, ok per pt   . CAD (coronary artery disease)     non obstructive per cath 09-2010    Past Surgical History  Procedure Date  . Orchiectomy     R  . Hernia repair     stomach  . Tonsillectomy     History   Social History  . Marital Status: Married    Spouse Name: N/A    Number of Children: 3  . Years of Education: N/A   Occupational History  . Not on file.   Social History Main Topics  . Smoking status: Former  Games developer  . Smokeless tobacco: Not on file  . Alcohol Use: Yes  . Drug Use: No  . Sexually Active: Not on file   Other Topics Concern  . Not on file   Social History Narrative  . No narrative on file    ROS: no fevers or chills, productive cough, hemoptysis, dysphasia, odynophagia, melena, hematochezia, dysuria, hematuria, rash, seizure activity, orthopnea, PND, pedal edema, claudication. Remaining systems are negative.  Physical Exam: Well-developed well-nourished in no acute distress.  Skin is warm and dry.  HEENT is normal.  Neck is supple. No thyromegaly.  Chest is clear to auscultation with normal expansion.  Cardiovascular exam is regular rate and rhythm.  Abdominal exam nontender or distended. No masses palpated. Extremities show no edema. Right radial catheter site with no hematoma. neuro grossly intact

## 2010-12-13 ENCOUNTER — Other Ambulatory Visit: Payer: Self-pay | Admitting: *Deleted

## 2010-12-13 DIAGNOSIS — E785 Hyperlipidemia, unspecified: Secondary | ICD-10-CM

## 2010-12-13 MED ORDER — NIACIN ER (ANTIHYPERLIPIDEMIC) 500 MG PO TBCR: EXTENDED_RELEASE_TABLET | ORAL | Status: DC

## 2010-12-31 NOTE — Telephone Encounter (Signed)
Pt was seen by cards 12-05-10

## 2011-01-07 ENCOUNTER — Encounter: Payer: Self-pay | Admitting: Family

## 2011-01-07 ENCOUNTER — Ambulatory Visit (INDEPENDENT_AMBULATORY_CARE_PROVIDER_SITE_OTHER): Payer: BC Managed Care – PPO | Admitting: Family

## 2011-01-07 DIAGNOSIS — R05 Cough: Secondary | ICD-10-CM | POA: Insufficient documentation

## 2011-01-07 DIAGNOSIS — M5440 Lumbago with sciatica, unspecified side: Secondary | ICD-10-CM | POA: Insufficient documentation

## 2011-01-07 DIAGNOSIS — S39012A Strain of muscle, fascia and tendon of lower back, initial encounter: Secondary | ICD-10-CM

## 2011-01-07 DIAGNOSIS — S335XXA Sprain of ligaments of lumbar spine, initial encounter: Secondary | ICD-10-CM

## 2011-01-07 DIAGNOSIS — R059 Cough, unspecified: Secondary | ICD-10-CM | POA: Insufficient documentation

## 2011-01-07 MED ORDER — BENZONATATE 100 MG PO CAPS: 100.0000 mg | ORAL_CAPSULE | Freq: Three times a day (TID) | ORAL | Status: AC | PRN

## 2011-01-07 MED ORDER — MELOXICAM 7.5 MG PO TABS: 7.5000 mg | ORAL_TABLET | Freq: Every day | ORAL | Status: DC

## 2011-01-07 MED ORDER — CEFUROXIME AXETIL 500 MG PO TABS: 500.0000 mg | ORAL_TABLET | Freq: Two times a day (BID) | ORAL | Status: AC

## 2011-01-07 NOTE — Progress Notes (Signed)
Subjective:    Patient ID: Gavin Parker, male    DOB: 08-18-1965, 45 y.o.   MRN: [id number]  HPI  Cough- x 1 week.  Reports that ENT treated him with a 20 day rx for sinusitis.  He reports that the CT scan showed blockages. Saw [facility] (Dr. Emeline Darling).  Cough- improved with delsym.  Productive of yellow cough.  Denies fever.    Back pain- "pulled something" in the lower back 1 month ago.  R hip stretches to the center.  He has tried Diplomatic Services operational officer, heat pads, ben gay.  He noted some shooting pain into the right hip.  Now localized to lower back. Denies lower extremity weakness.     Review of Systems Past Medical History  Diagnosis Date  . 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 Last visit w/ urology June 2011 ([place]); last CT of  the abdomen -chest in [place]:  10/11  . GERD (gastroesophageal reflux disease)   . Hyperlipidemia   . Hypothyroidism   . S/P colonoscopy     ~  03-2008, ok per pt   . CAD (coronary artery disease)     non obstructive per cath 09-2010    History   Social History  . Marital Status: Married    Spouse Name: N/A    Number of Children: 3  . Years of Education: N/A   Occupational History  . Not on file.   Social History Main Topics  . Smoking status: Former Games developer  . Smokeless tobacco: Not on file  . Alcohol Use: Yes  . Drug Use: No  . Sexually Active: Not on file   Other Topics Concern  . Not on file   Social History Narrative  . No narrative on file    Past Surgical History  Procedure Date  . Orchiectomy     R  . Hernia repair     stomach  . Tonsillectomy     Family History  Problem Relation Age of Onset  . Arthritis    . Diabetes    . Ovarian cancer      No Known Allergies  Current Outpatient Prescriptions on File Prior to Visit  Medication Sig Dispense Refill  . aspirin 81 MG tablet Take 81 mg by mouth daily.        . cetirizine (ZYRTEC) 10 MG tablet Take 1 tablet (10 mg total) by mouth daily.  30 tablet  6    . fluticasone (FLONASE) 50 MCG/ACT nasal spray Place 1 spray into the nose as directed. 1 spray in each nostril twice a day as needed. Use the "crossover" technique as discussed   16 g  2  . lansoprazole (PREVACID) 30 MG capsule Take 1 capsule (30 mg total) by mouth daily.  30 capsule  6  . levothyroxine (SYNTHROID, LEVOTHROID) 100 MCG tablet TAKE ONE TABLET BY MOUTH EVERY DAY  90 tablet  1  . simvastatin (ZOCOR) 40 MG tablet Take 1 tablet (40 mg total) by mouth at bedtime.  30 tablet  6    BP 110/80  Pulse 78  Temp(Src) 97.8 F (36.6 C) (Oral)  Resp 16  Ht 6\' 1"  (1.854 m)  Wt 229 lb 1.3 oz (103.91 kg)  BMI 30.22 kg/m2       Objective:   Physical Exam  Constitutional: He appears well-developed and well-nourished. No distress.  HENT:  Right Ear: Tympanic membrane is scarred. Tympanic membrane is not erythematous and not bulging.  Left Ear: Tympanic membrane is scarred. Tympanic membrane is not erythematous and not bulging.       Small, nontender mobile nodule (pea sized) noted at the base of skull.    Mild bilateral maxillary/frontal sinus tenderness to palpation  Cardiovascular: Normal rate and regular rhythm.   No murmur heard. Pulmonary/Chest: Effort normal and breath sounds normal. No respiratory distress. He has no wheezes. He has no rales. He exhibits no tenderness.  Skin: Skin is warm and dry.  Psychiatric: He has a normal mood and affect. His behavior is normal. Judgment and thought content normal.          Assessment & Plan:

## 2011-01-07 NOTE — Assessment & Plan Note (Signed)
Recommended short term treatment with meloxicam. Pt is instructed to contact Dr. Beverely Low if his symptoms worsen or if no improvement in 1-2 weeks.

## 2011-01-07 NOTE — Patient Instructions (Signed)
Call if your cough or back pain worsens of if no improvement.

## 2011-01-07 NOTE — Assessment & Plan Note (Signed)
I suspect that his primary issue is his recurrent/chronic sinusitis for which he is being followed by ENT.  Symptoms improved while on abx, then cough started several days after he completed abx.  Will plan to treat with ceftin to cover both sinus and pulmonary.  Add tessalon prn.

## 2011-01-14 ENCOUNTER — Encounter: Payer: Self-pay | Admitting: Family Medicine

## 2011-01-14 ENCOUNTER — Ambulatory Visit (INDEPENDENT_AMBULATORY_CARE_PROVIDER_SITE_OTHER): Payer: BC Managed Care – PPO | Admitting: Family Medicine

## 2011-01-14 DIAGNOSIS — S335XXA Sprain of ligaments of lumbar spine, initial encounter: Secondary | ICD-10-CM

## 2011-01-14 DIAGNOSIS — R05 Cough: Secondary | ICD-10-CM

## 2011-01-14 DIAGNOSIS — R059 Cough, unspecified: Secondary | ICD-10-CM

## 2011-01-14 DIAGNOSIS — S39012A Strain of muscle, fascia and tendon of lower back, initial encounter: Secondary | ICD-10-CM

## 2011-01-14 MED ORDER — PREDNISONE (PAK) 10 MG PO TABS: ORAL_TABLET | ORAL | Status: DC

## 2011-01-14 MED ORDER — CYCLOBENZAPRINE HCL 10 MG PO TABS: 10.0000 mg | ORAL_TABLET | Freq: Three times a day (TID) | ORAL | Status: AC | PRN

## 2011-01-14 NOTE — Progress Notes (Signed)
  Subjective:    Patient ID: Gavin Parker, male    DOB: October 06, 1965, 45 y.o.   MRN: [id number]  HPI Cough- is improving since starting Ceftin but now having thoracic back pain.    Back pain- pulled lower back about 1 month ago, pain was centered in lower back and then radiated to R hip.  Started mobic last week w/out much relief and now new pain in thoracic spine.  Pain will radiate from low back and hip into R leg.  'it's hard to put shoes and socks on'.  Pain is worse w/ movement.   Review of Systems For ROS see HPI     Objective:   Physical Exam  Vitals reviewed. Constitutional: He appears well-developed and well-nourished. No distress.  Pulmonary/Chest: Effort normal and breath sounds normal. No respiratory distress. He has no wheezes. He has no rales.       Mild dry cough  Musculoskeletal:       + R lumbar paraspinal TTP + R lat spasm (-) SLR Good flexion and extension          Assessment & Plan:

## 2011-01-14 NOTE — Assessment & Plan Note (Signed)
Cough is improving.  Finish ceftin as directed.  No evidence of PNA on exam.

## 2011-01-14 NOTE — Assessment & Plan Note (Signed)
Pt w/ R sided lat spasm and lumbar paraspinal tenderness.  Start flexeril at night and pred pack to decrease inflammation.  If no improvement in sxs will need to see ortho.  Pt expressed understanding and is in agreement w/ plan.

## 2011-01-14 NOTE — Patient Instructions (Signed)
Finish the Ceftin as directed for the cough Start the Prednisone as directed in the pack- take w/ food to avoid upset stomach Use the Flexeril at night for muscle spasm Heating pad as needed for pain relief If no improvement after the 12 days of meds, please call and we'll send you to ortho Call with any questions or concerns Hang in there!

## 2011-02-13 ENCOUNTER — Ambulatory Visit: Payer: BC Managed Care – PPO | Admitting: Family Medicine

## 2011-02-17 ENCOUNTER — Other Ambulatory Visit: Payer: Self-pay | Admitting: Family Medicine

## 2011-02-17 MED ORDER — LANSOPRAZOLE 30 MG PO CPDR: 30.0000 mg | DELAYED_RELEASE_CAPSULE | Freq: Every day | ORAL | Status: DC

## 2011-02-17 MED ORDER — SIMVASTATIN 40 MG PO TABS: 40.0000 mg | ORAL_TABLET | Freq: Every day | ORAL | Status: DC

## 2011-02-17 MED ORDER — LEVOTHYROXINE SODIUM 100 MCG PO TABS: 100.0000 ug | ORAL_TABLET | Freq: Every day | ORAL | Status: DC

## 2011-02-17 NOTE — Telephone Encounter (Signed)
rx sent to pharmacy by e-script For prevacid;simvastin-synthroid

## 2011-05-13 ENCOUNTER — Encounter: Payer: Self-pay | Admitting: Family Medicine

## 2011-05-13 ENCOUNTER — Ambulatory Visit (INDEPENDENT_AMBULATORY_CARE_PROVIDER_SITE_OTHER): Payer: Self-pay | Admitting: Family Medicine

## 2011-05-13 ENCOUNTER — Ambulatory Visit (HOSPITAL_BASED_OUTPATIENT_CLINIC_OR_DEPARTMENT_OTHER)
Admission: RE | Admit: 2011-05-13 | Discharge: 2011-05-13 | Disposition: A | Discharge status: Home / Self Care | Payer: BC Managed Care – PPO | Source: Ambulatory Visit | Attending: Family Medicine | Admitting: Family Medicine

## 2011-05-13 VITALS — BP 114/72 | HR 102 | Temp 98.1°F | Wt 228.0 lb

## 2011-05-13 DIAGNOSIS — S99929A Unspecified injury of unspecified foot, initial encounter: Secondary | ICD-10-CM

## 2011-05-13 DIAGNOSIS — M79669 Pain in unspecified lower leg: Secondary | ICD-10-CM

## 2011-05-13 DIAGNOSIS — M79609 Pain in unspecified limb: Secondary | ICD-10-CM | POA: Insufficient documentation

## 2011-05-13 DIAGNOSIS — S8990XA Unspecified injury of unspecified lower leg, initial encounter: Secondary | ICD-10-CM

## 2011-05-13 DIAGNOSIS — L988 Other specified disorders of the skin and subcutaneous tissue: Secondary | ICD-10-CM | POA: Insufficient documentation

## 2011-05-13 DIAGNOSIS — W2209XA Striking against other stationary object, initial encounter: Secondary | ICD-10-CM

## 2011-05-13 NOTE — Patient Instructions (Signed)
Contusion A bruise (contusion) or hematoma is a collection of blood under skin causing an area of discoloration. It is caused by an injury to blood vessels beneath the injured area with a release of blood into that area. As blood accumulates it is known as a hematoma. This collection of blood causes a blue to dark blue color. As the injury improves over days to weeks it turns to a yellowish color and then usually disappears completely over the same period of time. These generally resolve completely without problems. The hematoma rarely requires drainage. HOME CARE INSTRUCTIONS   Apply ice to the injured area for 15 to 20 minutes 3 to 4 times per day for the first 1 or 2 days.   Put the ice in a plastic bag and place a towel between the bag of ice and your skin. Discontinue the ice if it causes pain.   If bleeding is more than just a little, apply pressure to the area for at least thirty minutes to decrease the amount of bruising. Apply pressure and ice as your caregiver suggests.   If the injury is on an extremity, elevation of that part may help to decrease pain and swelling. Wrapping with an ace or supportive wrap may also be helpful. If the bruise is on a lower extremity and is painful, crutches may be helpful for a couple days.   If you have been given a tetanus shot because the skin was broken, your arm may get swollen, red and warm to touch at the shot site. This is a normal response to the medicine in the shot. If you did not receive a tetanus shot today because you did not recall when your last one was given, make sure to check with your caregiver's office and determine if one is needed. Generally for a "dirty" wound, you should receive a tetanus booster if you have not had one in the last five years. If you have a "clean" wound, you should receive a tetanus booster if you have not had one within the last ten years.  SEEK MEDICAL CARE IF:   You have pain not controlled with over the counter  medications. Only take over-the-counter or prescription medicines for pain, discomfort, or fever as directed by your caregiver. Do not use aspirin as it may cause bleeding.   You develop increasing pain or swelling in the area of injury.   You develop any problems which seem worse than the problems which brought you in.  SEEK IMMEDIATE MEDICAL CARE IF:   You have a fever.   You develop severe pain in the area of the bruise out of proportion to the initial injury.   The bruised area becomes red, tender, and swollen.  MAKE SURE YOU:   Understand these instructions.   Will watch your condition.   Will get help right away if you are not doing well or get worse.  Document Released: 12/25/2004 Document Revised: 11/27/2010 Document Reviewed: 11/03/2007 [employer] Patient Information 2012 [employer]. 

## 2011-05-13 NOTE — Progress Notes (Signed)
  Subjective:    Patient ID: Gavin Parker, male    DOB: 1965/12/21, 46 y.o.   MRN: [id number]  HPI  Pt here c/o pain in R calf and low leg with bruising after walking into hitch on [employer] truck while carrying couch.   No chest pain,  Sob.    Review of Systems As above    Objective:   Physical Exam  Constitutional: He is oriented to person, place, and time. He appears well-developed and well-nourished.  Musculoskeletal: He exhibits edema and tenderness.       Tenderness R calf  + ecchymosis calf and ankle No dec rom  Neurological: He is alert and oriented to person, place, and time.          Assessment & Plan:  Contusion R calf and leg---- r/o dvt with doppler                                            Elevate,  Warm compresses                                            rest

## 2011-06-16 ENCOUNTER — Encounter: Payer: Self-pay | Admitting: Gastroenterology

## 2011-06-16 ENCOUNTER — Telehealth: Payer: Self-pay | Admitting: *Deleted

## 2011-06-16 ENCOUNTER — Ambulatory Visit (INDEPENDENT_AMBULATORY_CARE_PROVIDER_SITE_OTHER): Payer: PRIVATE HEALTH INSURANCE | Admitting: Family

## 2011-06-16 ENCOUNTER — Encounter: Payer: Self-pay | Admitting: Family

## 2011-06-16 VITALS — BP 118/82 | HR 59 | Temp 98.0°F | Resp 16 | Wt 233.0 lb

## 2011-06-16 DIAGNOSIS — R195 Other fecal abnormalities: Secondary | ICD-10-CM

## 2011-06-16 DIAGNOSIS — K625 Hemorrhage of anus and rectum: Secondary | ICD-10-CM

## 2011-06-16 DIAGNOSIS — M7120 Synovial cyst of popliteal space [Baker], unspecified knee: Secondary | ICD-10-CM

## 2011-06-16 DIAGNOSIS — M5430 Sciatica, unspecified side: Secondary | ICD-10-CM

## 2011-06-16 DIAGNOSIS — M5440 Lumbago with sciatica, unspecified side: Secondary | ICD-10-CM

## 2011-06-16 LAB — CBC WITH DIFFERENTIAL/PLATELET
Basophils Absolute: 0 10*3/uL (ref 0.0–0.1)
HCT: 43.4 % (ref 39.0–52.0)
Lymphocytes Relative: 25 % (ref 12–46)
Lymphs Abs: 1.7 10*3/uL (ref 0.7–4.0)
MCV: 84.3 fL (ref 78.0–100.0)
Monocytes Absolute: 0.6 10*3/uL (ref 0.1–1.0)
Neutro Abs: 4.2 10*3/uL (ref 1.7–7.7)
RBC: 5.15 MIL/uL (ref 4.22–5.81)
RDW: 12.9 % (ref 11.5–15.5)
WBC: 6.8 10*3/uL (ref 4.0–10.5)

## 2011-06-16 MED ORDER — HYDROCORTISONE ACE-PRAMOXINE 1-1 % RE FOAM: 1.0000 | Freq: Two times a day (BID) | RECTAL | Status: AC

## 2011-06-16 NOTE — Assessment & Plan Note (Signed)
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 Consider further work up with GI issues are stable.

## 2011-06-16 NOTE — Telephone Encounter (Signed)
Lab normal. Pls notify pt that blood count is normal.

## 2011-06-16 NOTE — Assessment & Plan Note (Signed)
Pt with know hx of hemorrhoids and bright red blood on stool and tissue once a day x 3 days.  Heme positive today. Like due to hemorrhoids.  I have advised him to stop all NSAIDS.  Will refer to GI for further evaluation. Add Proctofoam.  Obtain stat CBC- hemodynamically stable today in office.  In the meantime, I have advised the patient to go directly to the ED if increased BRBPR or abdominal pain. He verbalizes understanding.

## 2011-06-16 NOTE — Telephone Encounter (Signed)
Attempted to reach pt and left message to return my call on home #. 

## 2011-06-16 NOTE — Progress Notes (Signed)
Subjective:    Patient ID: Gavin Parker, male    DOB: [birth year], 46 y.o.   MRN: [id number]  HPI  [name] is a 46 yr old male who presents today with chief complaint of rectal bleeding. Reports that  Saturday AM- had low back pain.  Reports some red blood streaked on stool Saturday and again once on Sunday when he wiped.  Today only noted blood on tissuepaper.  He denies rectal pain.  He reports hx of colo about 3 yrs ago- reportedly normal- except for hemorrhoid.  Took nsaids for 2 weeks straight due to injury.   Low back pain- he has not taken anything for this.  He reports that his low back pain shoots into the left leg.  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  Trouble bending after standing at work.    Review of Systems See HPI  Past Medical History  Diagnosis Date  . 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 Last visit w/ urology June 2011 ([place]); last CT of  the abdomen -chest in [place]:  10/11  . GERD (gastroesophageal reflux disease)   . Hyperlipidemia   . Hypothyroidism   . S/P colonoscopy     ~  03-2008, ok per pt   . CAD (coronary artery disease)     non obstructive per cath 09-2010    History   Social History  . Marital Status: Married    Spouse Name: N/A    Number of Children: 3  . Years of Education: N/A   Occupational History  . Not on file.   Social History Main Topics  . Smoking status: Former Games developer  . Smokeless tobacco: Not on file  . Alcohol Use: Yes  . Drug Use: No  . Sexually Active: Not on file   Other Topics Concern  . Not on file   Social History Narrative  . No narrative on file    Past Surgical History  Procedure Date  . Orchiectomy     R  . Hernia repair     stomach  . Tonsillectomy     Family History  Problem Relation Age of Onset  . Arthritis    . Diabetes    . Ovarian cancer      No Known Allergies  Current Outpatient Prescriptions on File Prior to Visit  Medication Sig Dispense  Refill  . aspirin 81 MG tablet Take 81 mg by mouth daily.        . cetirizine (ZYRTEC) 10 MG tablet Take 1 tablet (10 mg total) by mouth daily.  30 tablet  6  . cyclobenzaprine (FLEXERIL) 10 MG tablet Take 1 tablet (10 mg total) by mouth every 8 (eight) hours as needed for muscle spasms.  30 tablet  1  . fluticasone (FLONASE) 50 MCG/ACT nasal spray Place 1 spray into the nose as directed. 1 spray in each nostril twice a day as needed. Use the "crossover" technique as discussed   16 g  2  . lansoprazole (PREVACID) 30 MG capsule Take 1 capsule (30 mg total) by mouth daily.  30 capsule  6  . levothyroxine (SYNTHROID, LEVOTHROID) 100 MCG tablet Take 1 tablet (100 mcg total) by mouth daily.  90 tablet  3  . simvastatin (ZOCOR) 40 MG tablet Take 1 tablet (40 mg total) by mouth at bedtime.  30 tablet  6  . amoxicillin (AMOXIL) 875 MG tablet Take 875 mg by mouth 2 (two) times daily.      Marland Kitchen  guaiFENesin-codeine (ROBITUSSIN AC) 100-10 MG/5ML syrup Take 5 mLs by mouth 3 (three) times daily as needed.        BP 118/82  Pulse 59  Temp(Src) 98 F (36.7 C) (Oral)  Resp 16  Wt 233 lb 0.6 oz (105.706 kg)  SpO2 98%       Objective:   Physical Exam  Constitutional: He appears well-developed and well-nourished.  Cardiovascular: Normal rate and regular rhythm.   No murmur heard. Pulmonary/Chest: Effort normal and breath sounds normal. No respiratory distress. He has no wheezes. He has no rales. He exhibits no tenderness.  Abdominal: Soft. Bowel sounds are normal. He exhibits no distension. There is no tenderness.  Genitourinary: Guaiac positive stool.       Rectal exam normal, no palpable or visible hemorrhoids. Brown stool in vault, heme +  Musculoskeletal: He exhibits no edema.       Fullness noted behind L knee, bilateral LE strength is 5/5.   Psychiatric: He has a normal mood and affect. His speech is normal and behavior is normal. Judgment and thought content normal. Cognition and memory are  normal.          Assessment & Plan:

## 2011-06-16 NOTE — Telephone Encounter (Signed)
Received call from Robin at [facility] re: STAT CBC result. Result is in EPIC, please advise.

## 2011-06-16 NOTE — Patient Instructions (Signed)
Please complete blood work prior to leaving. Go to the ER if you develop worsening bleeding, or black stools. Avoid anti-inflammatories. Follow up on Wednesday. You will be contacted about your referral to GI. Follow up on Wednesday of this week.

## 2011-06-16 NOTE — Assessment & Plan Note (Signed)
Mild symptoms, no neuro deficits in LE today. Avoid NSAIDS for now.  He has rx for prn flexeril.  Recommended tylenol PRN for now.

## 2011-06-17 NOTE — Telephone Encounter (Signed)
Notified pt, he will f/u with GI tomorrow.

## 2011-06-18 ENCOUNTER — Ambulatory Visit: Payer: PRIVATE HEALTH INSURANCE | Admitting: Family

## 2011-06-18 ENCOUNTER — Encounter: Payer: Self-pay | Admitting: Family

## 2011-06-18 ENCOUNTER — Ambulatory Visit (INDEPENDENT_AMBULATORY_CARE_PROVIDER_SITE_OTHER): Payer: PRIVATE HEALTH INSURANCE | Admitting: Family

## 2011-06-18 VITALS — BP 118/80 | HR 72 | Temp 97.4°F | Resp 16 | Ht 73.0 in | Wt 233.1 lb

## 2011-06-18 DIAGNOSIS — M7120 Synovial cyst of popliteal space [Baker], unspecified knee: Secondary | ICD-10-CM

## 2011-06-18 DIAGNOSIS — M79606 Pain in leg, unspecified: Secondary | ICD-10-CM

## 2011-06-18 DIAGNOSIS — K625 Hemorrhage of anus and rectum: Secondary | ICD-10-CM

## 2011-06-18 DIAGNOSIS — M79609 Pain in unspecified limb: Secondary | ICD-10-CM

## 2011-06-18 NOTE — Patient Instructions (Signed)
Please schedule your ultrasound on the first floor.  Keep your upcoming appointment with Gastroenterology. Call if you have any further rectal bleeding.

## 2011-06-18 NOTE — Assessment & Plan Note (Signed)
Resolved. I recommended that he continue proctofoam for the next week or so, and keep upcoming appointment with GI.  He is instructed to contact us if he has further rectal bleeding.  H/H normal last visit.

## 2011-06-18 NOTE — Progress Notes (Signed)
Subjective:    Patient ID: Gavin Parker, male    DOB: 1965/12/01, 46 y.o.   MRN: [id number]  HPI  Mr.  Gavin Parker is a 46 yr old male who presents today for follow up of his rectal bleeding. Notes that he started proctofoam on Monday night and since that time, he has seen no further blood in the stool.    Leg pain- He continues to have pain and swelling behind the left knee and wishes to have further evaluation.   Review of Systems See HPI  Past Medical History  Diagnosis Date  . 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 Last visit w/ urology June 2011 ([place]); last CT of  the abdomen -chest in [place]:  10/11  . GERD (gastroesophageal reflux disease)   . Hyperlipidemia   . Hypothyroidism   . S/P colonoscopy     ~  03-2008, ok per pt   . CAD (coronary artery disease)     non obstructive per cath 09-2010    History   Social History  . Marital Status: Married    Spouse Name: N/A    Number of Children: 3  . Years of Education: N/A   Occupational History  . Not on file.   Social History Main Topics  . Smoking status: Former Games developer  . Smokeless tobacco: Not on file  . Alcohol Use: Yes  . Drug Use: No  . Sexually Active: Not on file   Other Topics Concern  . Not on file   Social History Narrative  . No narrative on file    Past Surgical History  Procedure Date  . Orchiectomy     R  . Hernia repair     stomach  . Tonsillectomy     Family History  Problem Relation Age of Onset  . Arthritis    . Diabetes    . Ovarian cancer      No Known Allergies  Current Outpatient Prescriptions on File Prior to Visit  Medication Sig Dispense Refill  . aspirin 81 MG tablet Take 81 mg by mouth daily.        . cetirizine (ZYRTEC) 10 MG tablet Take 1 tablet (10 mg total) by mouth daily.  30 tablet  6  . cyclobenzaprine (FLEXERIL) 10 MG tablet Take 1 tablet (10 mg total) by mouth every 8 (eight) hours as needed for muscle spasms.  30 tablet  1  .  fluticasone (FLONASE) 50 MCG/ACT nasal spray Place 1 spray into the nose as directed. 1 spray in each nostril twice a day as needed. Use the "crossover" technique as discussed   16 g  2  . hydrocortisone-pramoxine (PROCTOFOAM HC) rectal foam Place 1 applicator rectally 2 (two) times daily.  10 g  0  . lansoprazole (PREVACID) 30 MG capsule Take 1 capsule (30 mg total) by mouth daily.  30 capsule  6  . levothyroxine (SYNTHROID, LEVOTHROID) 100 MCG tablet Take 1 tablet (100 mcg total) by mouth daily.  90 tablet  3  . simvastatin (ZOCOR) 40 MG tablet Take 1 tablet (40 mg total) by mouth at bedtime.  30 tablet  6    BP 118/80  Pulse 72  Temp(Src) 97.4 F (36.3 C) (Oral)  Resp 16  Ht 6\' 1"  (1.854 m)  Wt 233 lb 1.3 oz (105.724 kg)  BMI 30.75 kg/m2  SpO2 97%       Objective:   Physical Exam  Constitutional: He appears well-developed and  well-nourished. No distress.  HENT:  Head: Normocephalic and atraumatic.  Cardiovascular: Normal rate and regular rhythm.   No murmur heard. Pulmonary/Chest: Effort normal and breath sounds normal. No respiratory distress. He has no wheezes. He has no rales. He exhibits no tenderness.  Musculoskeletal: He exhibits no edema.  Psychiatric: He has a normal mood and affect. His behavior is normal. Judgment and thought content normal.          Assessment & Plan:

## 2011-06-18 NOTE — Assessment & Plan Note (Signed)
New- Probably baker's cyst behind left knee.  Obtain ultrasound- then likely referral to orthopedics.

## 2011-06-20 ENCOUNTER — Ambulatory Visit (HOSPITAL_BASED_OUTPATIENT_CLINIC_OR_DEPARTMENT_OTHER)
Admission: RE | Admit: 2011-06-20 | Discharge: 2011-06-20 | Disposition: A | Discharge status: Home / Self Care | Payer: PRIVATE HEALTH INSURANCE | Source: Ambulatory Visit | Attending: Family | Admitting: Family

## 2011-06-20 ENCOUNTER — Telehealth: Payer: Self-pay | Admitting: Family

## 2011-06-20 DIAGNOSIS — M25569 Pain in unspecified knee: Secondary | ICD-10-CM

## 2011-06-20 DIAGNOSIS — M7120 Synovial cyst of popliteal space [Baker], unspecified knee: Secondary | ICD-10-CM | POA: Insufficient documentation

## 2011-06-20 DIAGNOSIS — R609 Edema, unspecified: Secondary | ICD-10-CM | POA: Insufficient documentation

## 2011-06-20 DIAGNOSIS — M7989 Other specified soft tissue disorders: Secondary | ICD-10-CM

## 2011-06-20 DIAGNOSIS — R937 Abnormal findings on diagnostic imaging of other parts of musculoskeletal system: Secondary | ICD-10-CM

## 2011-06-20 DIAGNOSIS — M79606 Pain in leg, unspecified: Secondary | ICD-10-CM

## 2011-06-20 NOTE — Telephone Encounter (Signed)
Left message on cell# to return my call. 

## 2011-06-20 NOTE — Telephone Encounter (Signed)
Pt notified and transferred to referral co-ordinator re: appt.

## 2011-06-20 NOTE — Telephone Encounter (Signed)
Please call pt and let him know that his ultrasound does show bakers cyst.  I will refer to ortho.

## 2011-07-04 ENCOUNTER — Ambulatory Visit: Payer: PRIVATE HEALTH INSURANCE | Admitting: Gastroenterology

## 2011-07-08 ENCOUNTER — Telehealth: Payer: Self-pay | Admitting: Family Medicine

## 2011-07-08 NOTE — Telephone Encounter (Signed)
Opened in error

## 2011-07-14 ENCOUNTER — Ambulatory Visit (HOSPITAL_BASED_OUTPATIENT_CLINIC_OR_DEPARTMENT_OTHER)
Admission: RE | Admit: 2011-07-14 | Discharge: 2011-07-14 | Disposition: A | Discharge status: Home / Self Care | Payer: PRIVATE HEALTH INSURANCE | Source: Ambulatory Visit | Attending: Family Medicine | Admitting: Family Medicine

## 2011-07-14 ENCOUNTER — Ambulatory Visit (INDEPENDENT_AMBULATORY_CARE_PROVIDER_SITE_OTHER): Payer: PRIVATE HEALTH INSURANCE | Admitting: Family Medicine

## 2011-07-14 ENCOUNTER — Encounter: Payer: Self-pay | Admitting: Family Medicine

## 2011-07-14 VITALS — BP 116/77 | HR 71 | Temp 98.0°F | Ht 72.0 in | Wt 233.5 lb

## 2011-07-14 DIAGNOSIS — E039 Hypothyroidism, unspecified: Secondary | ICD-10-CM

## 2011-07-14 DIAGNOSIS — Z0389 Encounter for observation for other suspected diseases and conditions ruled out: Secondary | ICD-10-CM

## 2011-07-14 DIAGNOSIS — C6290 Malignant neoplasm of unspecified testis, unspecified whether descended or undescended: Secondary | ICD-10-CM

## 2011-07-14 DIAGNOSIS — E785 Hyperlipidemia, unspecified: Secondary | ICD-10-CM

## 2011-07-14 DIAGNOSIS — K625 Hemorrhage of anus and rectum: Secondary | ICD-10-CM

## 2011-07-14 DIAGNOSIS — R0989 Other specified symptoms and signs involving the circulatory and respiratory systems: Secondary | ICD-10-CM

## 2011-07-14 DIAGNOSIS — Z87891 Personal history of nicotine dependence: Secondary | ICD-10-CM | POA: Insufficient documentation

## 2011-07-14 LAB — BASIC METABOLIC PANEL
Calcium: 9.2 mg/dL (ref 8.4–10.5)
GFR: 90.19 mL/min (ref >60.00)
Glucose, Bld: 113 mg/dL — ABNORMAL HIGH (ref 70–99)
Sodium: 139 mEq/L (ref 135–145)

## 2011-07-14 LAB — LIPID PANEL
HDL: 35.3 mg/dL — ABNORMAL LOW (ref >39.00)
Total CHOL/HDL Ratio: 4
Triglycerides: 108 mg/dL (ref 0.0–149.0)
VLDL: 21.6 mg/dL (ref 0.0–40.0)

## 2011-07-14 LAB — HEPATIC FUNCTION PANEL
Albumin: 4.3 g/dL (ref 3.5–5.2)
Alkaline Phosphatase: 51 U/L (ref 39–117)
Total Bilirubin: 0.5 mg/dL (ref 0.3–1.2)

## 2011-07-14 LAB — TSH: TSH: 1.88 u[IU]/mL (ref 0.35–5.50)

## 2011-07-14 MED ORDER — SIMVASTATIN 40 MG PO TABS: 40.0000 mg | ORAL_TABLET | Freq: Every day | ORAL | Status: DC

## 2011-07-14 MED ORDER — HYDROCORTISONE ACE-PRAMOXINE 1-1 % RE FOAM: Freq: Three times a day (TID) | RECTAL | Status: DC

## 2011-07-14 NOTE — Patient Instructions (Signed)
Schedule your complete physical at your convenience We'll notify you of your lab results Go to the [facility] and get your chest xray Call with any questions or concerns [place]!!

## 2011-07-14 NOTE — Progress Notes (Signed)
  Subjective:    Patient ID: Gavin Parker, male    DOB: 03-11-1966, 46 y.o.   MRN: [id number]  HPI Heme + stool- saw Melissa and was given Proctofoam which improved sxs.  Has pain and 'sensation' of hemorrhoid.  Used Preparation H over the weekend w/ improvement of sxs.  Has GI appt on Friday.  Testicular Cancer- due for CXR per Dr Laverle Patter.  Due for PSA.  Hyperlipidemia- chronic problem, on Simvastatin.  Denies abd pain, N/V, myalgias.  Hypothyroid- chronic problem, on synthroid.  Denies fatigue, cold/hot intolerance, changes to hair/nails.   Review of Systems For ROS see HPI     Objective:   Physical Exam  Vitals reviewed. Constitutional: He is oriented to person, place, and time. He appears well-developed and well-nourished. No distress.  HENT:  Head: Normocephalic and atraumatic.  Eyes: Conjunctivae and EOM are normal. Pupils are equal, round, and reactive to light.  Neck: Normal range of motion. Neck supple. No thyromegaly present.  Cardiovascular: Normal rate, regular rhythm, normal heart sounds and intact distal pulses.   No murmur heard. Pulmonary/Chest: Effort normal and breath sounds normal. No respiratory distress.  Abdominal: Soft. Bowel sounds are normal. He exhibits no distension.  Musculoskeletal: He exhibits no edema.  Lymphadenopathy:    He has no cervical adenopathy.  Neurological: He is alert and oriented to person, place, and time. No cranial nerve deficit.  Skin: Skin is warm and dry.  Psychiatric: He has a normal mood and affect. His behavior is normal.          Assessment & Plan:

## 2011-07-18 ENCOUNTER — Encounter: Payer: Self-pay | Admitting: Gastroenterology

## 2011-07-18 ENCOUNTER — Other Ambulatory Visit (INDEPENDENT_AMBULATORY_CARE_PROVIDER_SITE_OTHER): Payer: PRIVATE HEALTH INSURANCE

## 2011-07-18 ENCOUNTER — Ambulatory Visit (INDEPENDENT_AMBULATORY_CARE_PROVIDER_SITE_OTHER): Payer: PRIVATE HEALTH INSURANCE | Admitting: Gastroenterology

## 2011-07-18 ENCOUNTER — Other Ambulatory Visit: Payer: PRIVATE HEALTH INSURANCE

## 2011-07-18 ENCOUNTER — Ambulatory Visit: Payer: PRIVATE HEALTH INSURANCE | Admitting: Gastroenterology

## 2011-07-18 DIAGNOSIS — Z8719 Personal history of other diseases of the digestive system: Secondary | ICD-10-CM

## 2011-07-18 DIAGNOSIS — K649 Unspecified hemorrhoids: Secondary | ICD-10-CM

## 2011-07-18 DIAGNOSIS — R7309 Other abnormal glucose: Secondary | ICD-10-CM

## 2011-07-18 DIAGNOSIS — K625 Hemorrhage of anus and rectum: Secondary | ICD-10-CM

## 2011-07-18 MED ORDER — HYDROCORTISONE ACETATE 25 MG RE SUPP: 25.0000 mg | Freq: Every day | RECTAL | Status: AC

## 2011-07-18 NOTE — Progress Notes (Signed)
LABS ONLY  

## 2011-07-18 NOTE — Patient Instructions (Addendum)
Please go to the basement today for your labs.  Today in the office you watched the video on hemorrhoids.  Follow the High Fiber diet below.  Buy metamucil OTC and take once a day.    High Fiber Diet A high fiber diet changes your normal diet to include more whole grains, legumes, fruits, and vegetables. Changes in the diet involve replacing refined carbohydrates with unrefined foods. The calorie level of the diet is essentially unchanged. The Dietary Reference Intake (recommended amount) for adult males is 38 g per day. For adult females, it is 25 g per day. Pregnant and lactating women should consume 28 g of fiber per day. Fiber is the intact part of a plant that is not broken down during digestion. Functional fiber is fiber that has been isolated from the plant to provide a beneficial effect in the body. PURPOSE  Increase stool bulk.   Ease and regulate bowel movements.   Lower cholesterol.  INDICATIONS THAT YOU NEED MORE FIBER  Constipation and hemorrhoids.   Uncomplicated diverticulosis (intestine condition) and irritable bowel syndrome.   Weight management.   As a protective measure against hardening of the arteries (atherosclerosis), diabetes, and cancer.  NOTE OF CAUTION If you have a digestive or bowel problem, ask your caregiver for advice before adding high fiber foods to your diet. Some of the following medical problems are such that a high fiber diet should not be used without consulting your caregiver:  Acute diverticulitis (intestine infection).   Partial small bowel obstructions.   Complicated diverticular disease involving bleeding, rupture (perforation), or abscess (boil, furuncle).   Presence of autonomic neuropathy (nerve damage) or gastric paresis (stomach cannot empty itself).  GUIDELINES FOR INCREASING FIBER  Start adding fiber to the diet slowly. A gradual increase of about 5 more grams (2 slices of whole-wheat bread, 2 servings of most fruits or  vegetables, or 1 bowl of high fiber cereal) per day is best. Too rapid an increase in fiber may result in constipation, flatulence, and bloating.   Drink enough water and fluids to keep your urine clear or pale yellow. Water, juice, or caffeine-free drinks are recommended. Not drinking enough fluid may cause constipation.   Eat a variety of high fiber foods rather than one type of fiber.   Try to increase your intake of fiber through using high fiber foods rather than fiber pills or supplements that contain small amounts of fiber.   The goal is to change the types of food eaten. Do not supplement your present diet with high fiber foods, but replace foods in your present diet.  INCLUDE A VARIETY OF FIBER SOURCES  Replace refined and processed grains with whole grains, canned fruits with fresh fruits, and incorporate other fiber sources. White rice, white breads, and most bakery goods contain little or no fiber.   Brown whole-grain rice, buckwheat oats, and many fruits and vegetables are all good sources of fiber. These include: broccoli, Brussels sprouts, cabbage, cauliflower, beets, sweet potatoes, white potatoes (skin on), carrots, tomatoes, eggplant, squash, berries, fresh fruits, and dried fruits.   Cereals appear to be the richest source of fiber. Cereal fiber is found in whole grains and bran. Bran is the fiber-rich outer coat of cereal grain, which is largely removed in refining. In whole-grain cereals, the bran remains. In breakfast cereals, the largest amount of fiber is found in those with "bran" in their names. The fiber content is sometimes indicated on the label.   You may need to  include additional fruits and vegetables each day.   In baking, for 1 cup white flour, you may use the following substitutions:   1 cup whole-wheat flour minus 2 tbs.    cup white flour plus  cup whole-wheat flour.  Document Released: 03/17/2005 Document Revised: 03/06/2011 Document Reviewed:  01/23/2009 [facility] Patient Information 2012 [place], [place].

## 2011-07-18 NOTE — Progress Notes (Signed)
History of Present Illness:  This is a 46 year old Caucasian male with several years of recurrent asymptomatic rectal bleeding. He has had 2 colonoscopies in [place] apparently in 2007 in 2010. The patient relates he did not have polyposis. He continues with occasional intermittent bright red blood per rectum, and apparently had a thrombosed hemorrhoid in December. He currently denies abdominal rectal pain, but has had some asymptomatic bright red blood per him over the last month. He denies other gastrointestinal symptomatology except for chronic GERD, and apparently also had a negative endoscopy in [place]. He does take daily Prevacid 30 mg. He specifically denies dysphagia, hepatobiliary complaints, anorexia or weight loss. The patient has had previous resection for testicular carcinoma.  I have reviewed this patient's present history, medical and surgical past history, allergies and medications.     ROS: The remainder of the 10 point ROS is negative.. he specifically denies current genitourinary complaints.     Physical Exam: Blood pressure 100/74, pulse 72 and regular, weight 230 pounds, and BMI of 31.24. General well developed well nourished patient in no acute distress, appearing his stated age Eyes PERRLA, no icterus, fundoscopic exam per opthamologist Skin no lesions noted Neck supple, no adenopathy, no thyroid enlargement, no tenderness Chest clear to percussion and auscultation Heart no significant murmurs, gallops or rubs noted Abdomen no hepatosplenomegaly masses or tenderness, BS normal.  Rectal inspection normal no fissures, or fistulae noted.  No masses or tenderness on digital exam. Stool not present for examination. There is no mucus or blood in his rectum. Also, I cannot see any active hemorrhoidal tissue at this time, fissures or fistulae. Extremities no acute joint lesions, edema, phlebitis or evidence of cellulitis. Neurologic patient oriented x 3, cranial nerves intact,  no focal neurologic deficits noted. Psychological mental status normal and normal affect.  Assessment and plan: Recurrent bleeding in the for mixed hemorrhoids associated with mild constipation. I have placed him on a high fiber diet, daily Metamucil, and when necessary Anusol-HC suppositories. He is to return IFOB stool cards for occult blood. I have offered him repeat colonoscopy and possible hemorrhoidal banding, but he wants to proceed conservatively at this time. He is continue other medications as listed and reviewed per primary care. I have asked him to take his Prevacid 30 minutes before first meal of the day for his chronic GERD. Review of his labs shows no evidence of anemia or other metabolic abnormalities. Encounter Diagnosis  Name Primary?  . Hemorrhoids Yes

## 2011-07-21 ENCOUNTER — Encounter: Payer: Self-pay | Admitting: *Deleted

## 2011-07-29 NOTE — Assessment & Plan Note (Signed)
Already has GI appt pending.  Will refill hemorrhoid meds.

## 2011-07-29 NOTE — Assessment & Plan Note (Signed)
Chronic problem.  Due for labs.  Adjust meds prn. 

## 2011-07-29 NOTE — Assessment & Plan Note (Signed)
Per pt's report urology wants him to have yearly CXR.  Ordered.

## 2011-07-29 NOTE — Assessment & Plan Note (Signed)
Chronic problem.  Due for labs.  Tolerating statin w/out difficulty.  Will adjust meds prn based on lab results

## 2011-08-20 ENCOUNTER — Encounter: Payer: PRIVATE HEALTH INSURANCE | Admitting: Family Medicine

## 2011-09-29 ENCOUNTER — Ambulatory Visit (INDEPENDENT_AMBULATORY_CARE_PROVIDER_SITE_OTHER): Payer: Self-pay | Admitting: Family Medicine

## 2011-09-29 ENCOUNTER — Encounter: Payer: Self-pay | Admitting: Family Medicine

## 2011-09-29 VITALS — BP 127/77 | HR 80 | Temp 98.0°F | Ht 71.0 in | Wt 230.3 lb

## 2011-09-29 DIAGNOSIS — H6990 Unspecified Eustachian tube disorder, unspecified ear: Secondary | ICD-10-CM | POA: Insufficient documentation

## 2011-09-29 DIAGNOSIS — R1030 Lower abdominal pain, unspecified: Secondary | ICD-10-CM

## 2011-09-29 DIAGNOSIS — H6980 Other specified disorders of Eustachian tube, unspecified ear: Secondary | ICD-10-CM

## 2011-09-29 DIAGNOSIS — R109 Unspecified abdominal pain: Secondary | ICD-10-CM

## 2011-09-29 NOTE — Progress Notes (Signed)
  Subjective:    Patient ID: Gavin Parker, male    DOB: 11-Aug-1965, 46 y.o.   MRN: [id number]  HPI Groin pain- L testicular pain starting Thursday.  Worse w/ running.  Did some heavy lifting at work on Thursday.  No pain w/ urination.  No swelling or redness.  Some low back pain.  No blood in urine.  No pain w/ bearing down for BM.  Pain was worst on Friday night but has improved since then.  R ear pain- sxs started last week, 'feels like it's clogged'.  + itching.  + nausea, light headed.  No fevers.   Review of Systems For ROS see HPI     Objective:   Physical Exam  Vitals reviewed. Constitutional: He is oriented to person, place, and time. He appears well-developed and well-nourished. No distress.  HENT:  Head: Normocephalic and atraumatic.  Mouth/Throat: Oropharynx is clear and moist. No oropharyngeal exudate.       L TM WNL R TM retracted w/ extensive scarring Bilateral nasal turbinate edema, R>L  Eyes: Conjunctivae and EOM are normal. Pupils are equal, round, and reactive to light.  Abdominal: Soft. Bowel sounds are normal. He exhibits no distension and no mass (no palpable bulge or palpable muscle defect that would be consistent w/ hernia). There is tenderness (over L inguinal canal). There is no rebound and no guarding.  Genitourinary: Left testis shows no mass, no swelling and no tenderness.       R testicle surgically absent  Neurological: He is alert and oriented to person, place, and time. No cranial nerve deficit. Coordination normal.          Assessment & Plan:

## 2011-09-29 NOTE — Assessment & Plan Note (Signed)
New.  Right now more consistent w/ muscle strain due to heavy lifting at work.  No palpable muscle defect or bulge consistent w/ hernia.  Pt to start NSAIDs for pain relief, if no improvement to call urology.  Pt expressed understanding and is in agreement w/ plan.

## 2011-09-29 NOTE — Assessment & Plan Note (Signed)
New.  Pt's ear not consistent w/ infxn but rather TM retraction and nasal turbinate edema points to eustachian tube dysfxn.  Start OTC decongestant.  Reviewed supportive care and red flags that should prompt return.  Pt expressed understanding and is in agreement w/ plan.

## 2011-09-29 NOTE — Patient Instructions (Addendum)
The dizziness, ear fullness and nausea are all due to congestion (eustachian tube dysfunction) Start OTC nasal decongestant (sudafed or similar) to improve congestion, ear pain and dizziness Drink plenty of fluids Start OTC Naproxen (aleve) twice daily for the groin pain.  If the symptoms worsen or don't improve- call the urologist Call with any questions or concerns Hang in there!!!

## 2011-10-01 ENCOUNTER — Telehealth: Payer: Self-pay | Admitting: Family Medicine

## 2011-10-01 MED ORDER — NEOMYCIN-COLIST-HC-THONZONIUM 3.3-3-10-0.5 MG/ML OT SUSP: OTIC | Status: DC

## 2011-10-01 NOTE — Telephone Encounter (Signed)
Ok for Cortisporin TC Otic suspension (NOT SOLUTION) 4 drops in affected ear TID for 7-10 days.  Since pt's sxs are improving w/ sudafed we did treat the condition that he presented with.  He did not have outer ear pain or pain w/ moving/touching the ear.  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

## 2011-10-01 NOTE — Telephone Encounter (Signed)
AOZ:HYQMVH pt home number and family member noted that he was not home to call back later, requested family member to please let pt know that his doctor office with MD Beverely Low has tried to contact him, family member noted to not understand very well, repeated she will tell him ,called pt on mobile and gave instructions verba tum per pt complaint to CAN, pt stated "Yeah it was not hurting when I was in there but then started hurting", pt understood instructions and new medication/directions and noted to send the medication to [facility] on [address], new prescription sent via escribe, MD Tabori advised verbally

## 2011-10-01 NOTE — Telephone Encounter (Signed)
Caller: Chrales/Patient; PCP: Sheliah Hatch.; CB#: [phone];  Call regarding Ear Pain and itching.  On Sudafed for same without relief.  Seen on 09/29/11.  Pain described as mild throbbing.  Reports itching and mild pain when touches outer ear near opening.  Relates some improvement of dizziness / lightheadedness since taking Sudafed. Caller asked for antibiotic; denies history of swimmer's ear.  Advised he needs to be seen if he feels he needs oral antibiotic. Caller became very upset and indicated he plans to call [employer] and stated," This is fucking ridiculous.  Should have got it right the first time."  He relates that he will be at  the office if he does not get a response in an hour. Information noted and sent to office for follow up.  Ear:  Sx protocol used.  He hung up before home care coudl be advised.

## 2011-10-04 ENCOUNTER — Emergency Department (HOSPITAL_COMMUNITY)
Admission: EM | Admit: 2011-10-04 | Discharge: 2011-10-04 | Discharge status: Absent without leave | Payer: Self-pay | Source: Home / Self Care

## 2011-12-29 ENCOUNTER — Telehealth: Payer: Self-pay | Admitting: Family Medicine

## 2011-12-29 IMAGING — CT CT ANGIO CHEST
2 of 6 series · 19 of 36 positions shown · IV contrast (APPLIED)
Comparison: None.

CLINICAL DATA: Shortness of breath.  History of testicular cancer.

CT ANGIOGRAPHY CHEST WITH CONTRAST
TECHNIQUE: Multidetector CT imaging of the chest was performed
using the standard protocol during bolus administration of
intravenous contrast.  Multiplanar CT image reconstructions
including MIPs were obtained to evaluate the vascular anatomy.
Contrast:  80 cc Omnipaque 350

[Series 5: pe 1.0 b25f · axial · 0.71mm/px · z∈[+818,+1016]mm · 18 of 221 slices shown]
[im 12/221  lung]
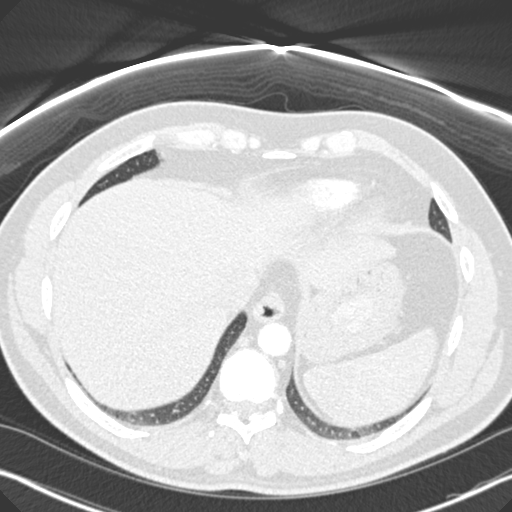
[im 23/221  mediastinal]
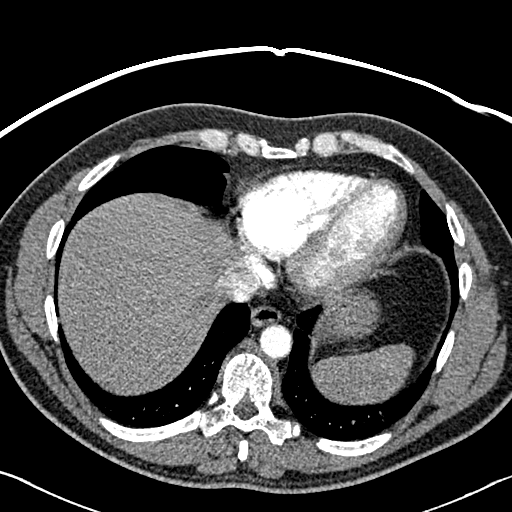
[im 34/221  lung]
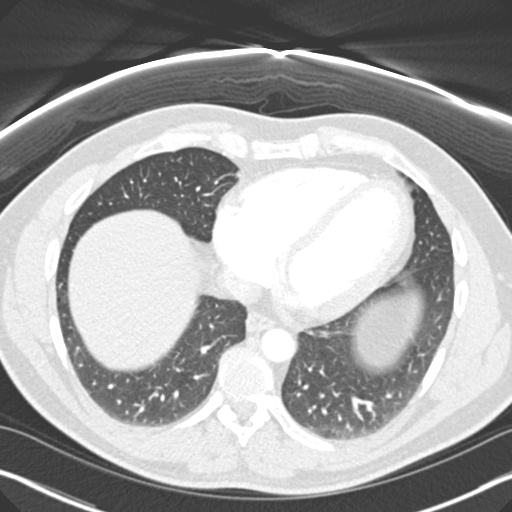
[im 45/221  mediastinal]
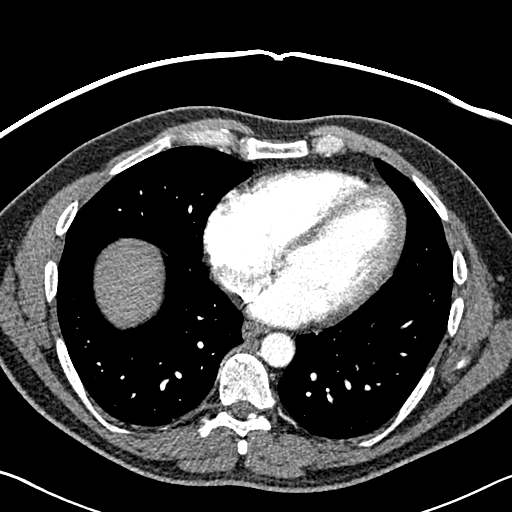
[im 56/221  lung]
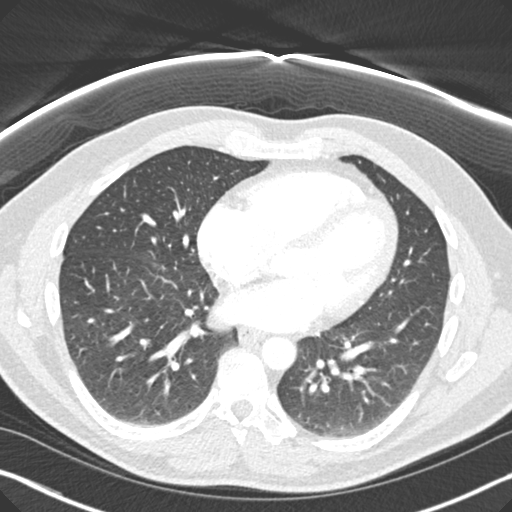
[im 67/221  mediastinal]
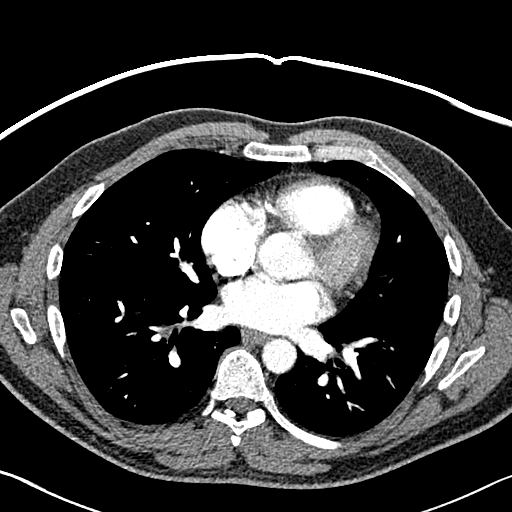
[im 78/221  lung]
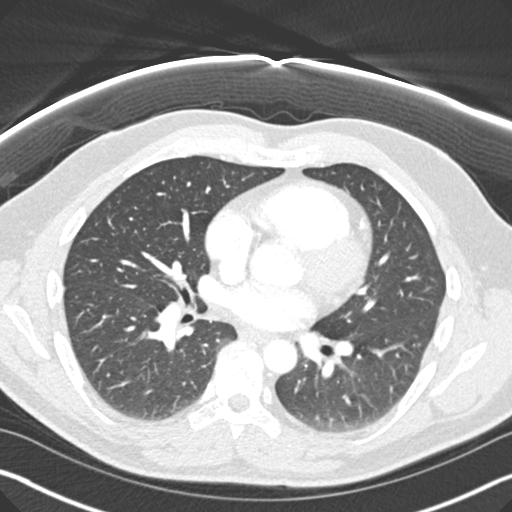
[im 89/221  mediastinal]
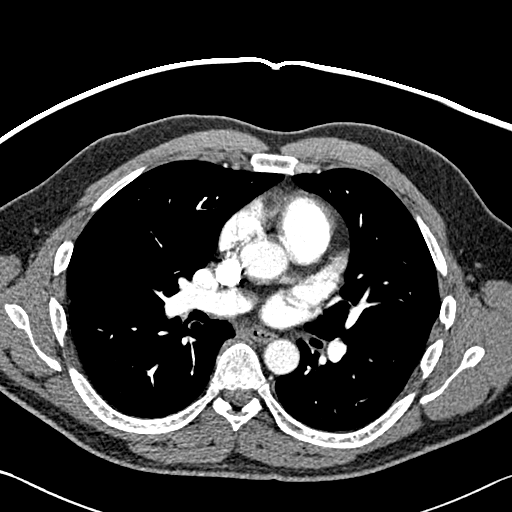
[im 100/221  lung]
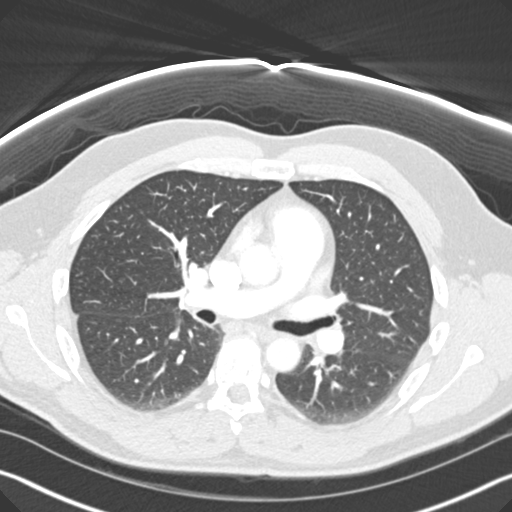
[im 122/221  mediastinal]
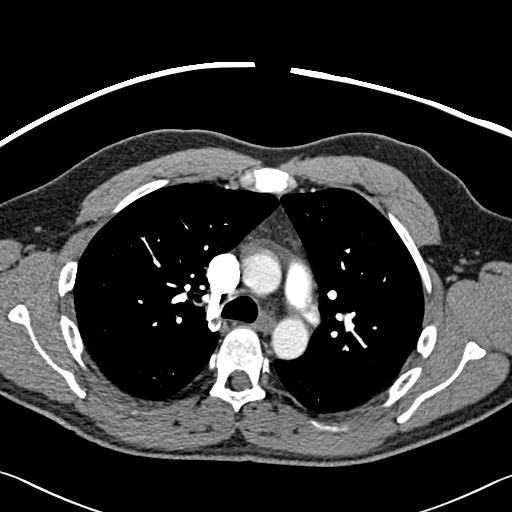
[im 133/221  lung]
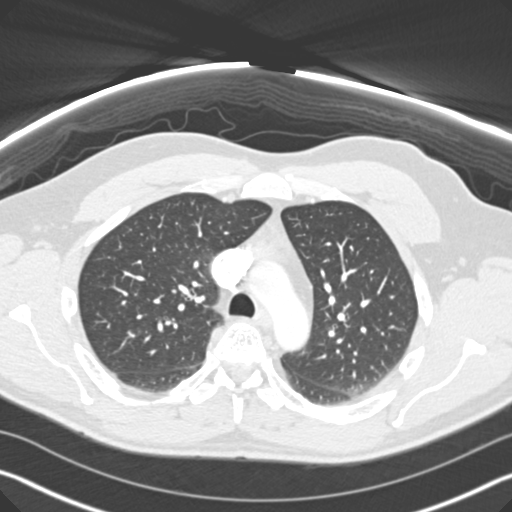
[im 144/221  mediastinal]
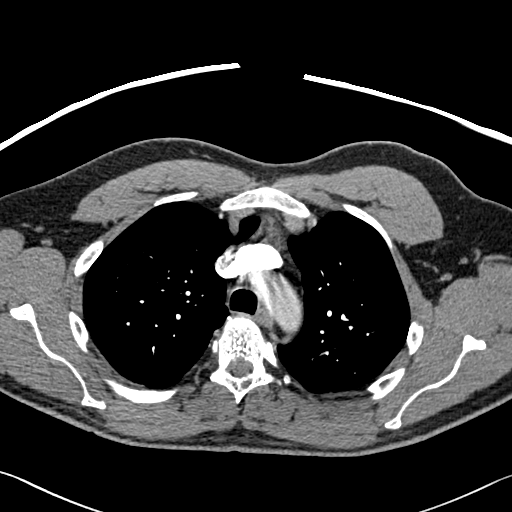
[im 155/221  lung]
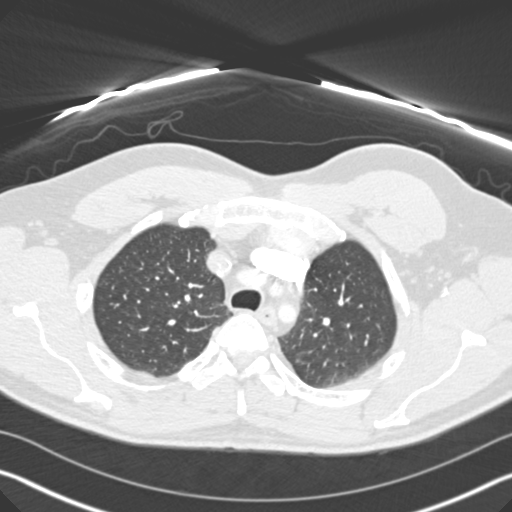
[im 166/221  mediastinal]
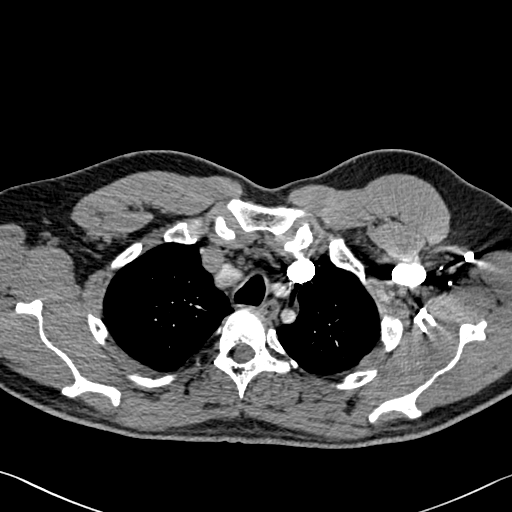
[im 177/221  lung]
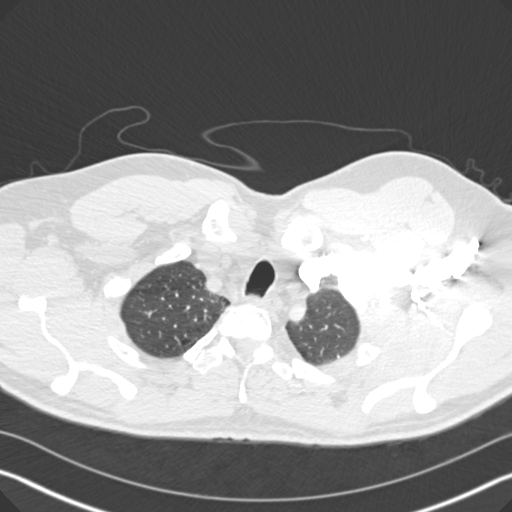
[im 188/221  mediastinal]
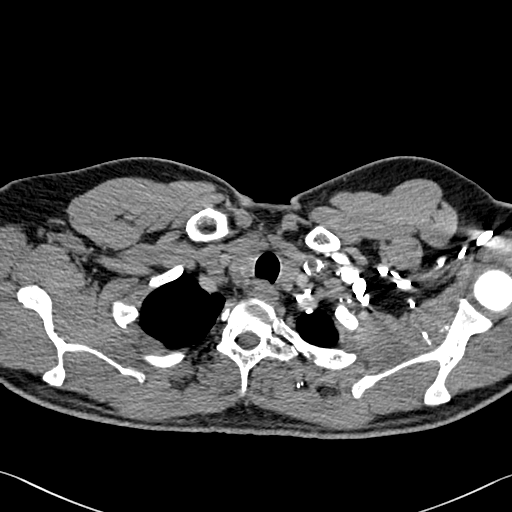
[im 199/221  lung]
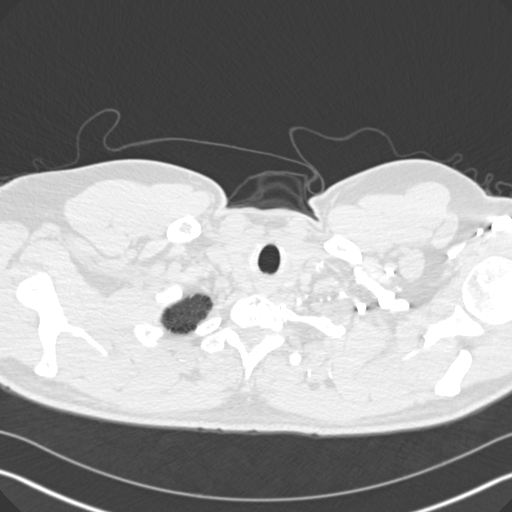
[im 210/221  mediastinal]
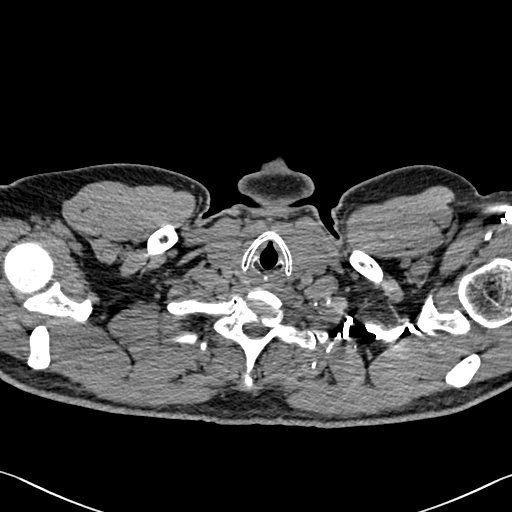

[Series 7: pe 2.0 coronal · coronal · 0.43mm/px · 1 of 131 slices shown]
[im 66/131  mediastinal]
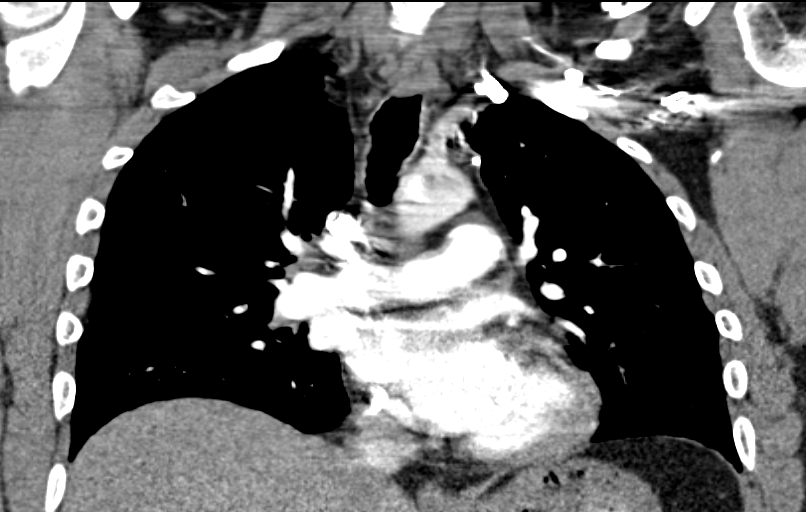

[19 of 36 positions shown; findings below may reference images not displayed]

FINDINGS: There are no filling defects in the opacified pulmonary
arteries to suggest the presence of an acute pulmonary embolus.  No
evidence for thoracic aortic aneurysm.  No thoracic aortic
dissection.  The patient does have a trace amount of fluid in the
superior pericardial recess.  No evidence for pericardial effusion.
Heart size is at upper normal.  No evidence for pleural effusion.

The lungs are clear bilaterally without evidence for focal
pneumonia or parenchymal nodules/mass.

Bone windows reveal no worrisome lytic or sclerotic osseous
lesions.

Review of the MIP images confirms the above findings.
IMPRESSION: No CT evidence for acute pulmonary embolus.

No findings to explain the patient's history of shortness of
breath.

## 2011-12-29 MED ORDER — LEVOTHYROXINE SODIUM 100 MCG PO TABS: 100.0000 ug | ORAL_TABLET | Freq: Every day | ORAL | Status: DC

## 2011-12-29 MED ORDER — SIMVASTATIN 40 MG PO TABS: 40.0000 mg | ORAL_TABLET | Freq: Every day | ORAL | Status: DC

## 2011-12-29 NOTE — Telephone Encounter (Signed)
Refill: Synthroid 100 mcg tab. Take one tablet by mouth every day. Qty 90. Last fill 8.4.13  Simvastatin 40mg  tab. Take one tablet by mouth at bedtime. Qty 30. Last fill 9.29.13

## 2011-12-29 NOTE — Telephone Encounter (Signed)
rx sent to pharmacy by e-script for 30 no refills Called home number and was advised by family member to call back and leave a message on the home machine for the pt, advised pt is overdue for CPE and needs to call and schedule asap

## 2012-02-13 ENCOUNTER — Encounter: Payer: Self-pay | Admitting: *Deleted

## 2012-02-13 ENCOUNTER — Ambulatory Visit (INDEPENDENT_AMBULATORY_CARE_PROVIDER_SITE_OTHER): Payer: Self-pay | Admitting: Family Medicine

## 2012-02-13 ENCOUNTER — Encounter: Payer: Self-pay | Admitting: Family Medicine

## 2012-02-13 VITALS — BP 118/62 | HR 73 | Temp 98.5°F | Wt 229.0 lb

## 2012-02-13 DIAGNOSIS — M79673 Pain in unspecified foot: Secondary | ICD-10-CM | POA: Insufficient documentation

## 2012-02-13 DIAGNOSIS — N63 Unspecified lump in unspecified breast: Secondary | ICD-10-CM

## 2012-02-13 DIAGNOSIS — M79609 Pain in unspecified limb: Secondary | ICD-10-CM

## 2012-02-13 MED ORDER — SIMVASTATIN 40 MG PO TABS: 40.0000 mg | ORAL_TABLET | Freq: Every day | ORAL | Status: DC

## 2012-02-13 MED ORDER — NAPROXEN 500 MG PO TABS: 500.0000 mg | ORAL_TABLET | Freq: Two times a day (BID) | ORAL | Status: DC

## 2012-02-13 MED ORDER — LEVOTHYROXINE SODIUM 100 MCG PO TABS: 100.0000 ug | ORAL_TABLET | Freq: Every day | ORAL | Status: DC

## 2012-02-13 NOTE — Progress Notes (Signed)
  Subjective:    Patient ID: Gavin Parker, male    DOB: March 27, 1966, 46 y.o.   MRN: [id number]  HPI Foot swelling- intermittently on dorsum of L foot.  Was placed on Prednsione x10 days.  Has been using Asian foot patch x2 days 'this stuff is great'.  Will swell w/ weight bearing.  When plantar flexing foot or curling toes will have pulling sensation along top of foot.  Wears steel toe boots to work w/out arch support.  Was told it was tendonitis.  Chest wall pain- L breast, was told by UC it was muscular.  Pain w/ internal rotation of L arm.  Pain has been on and off for months.  Had normal cxr in April.  Pt reports he feels breast mass that is TTP on L.  Hx of testicular cancer.   Review of Systems For ROS see HPI     Objective:   Physical Exam  Vitals reviewed. Constitutional: He appears well-developed and well-nourished. No distress.  Cardiovascular: Normal rate, regular rhythm and normal heart sounds.   Pulmonary/Chest: Effort normal and breath sounds normal. No respiratory distress. He has no wheezes. He has no rales. He exhibits tenderness (over L pec and particularly over 1 cm firm, freely mobile mass 1 inch from areola at 1 o'clock position).  Musculoskeletal:       Some discomfort w/ internal rotation of shoulder and tension on L pec No pain w/ L shoulder forward flexion, abduction or external rotation  L foot w/ trace swelling over dorusm, mild TTP over soft tissue structures but no bony tenderness          Assessment & Plan:

## 2012-02-13 NOTE — Patient Instructions (Addendum)
We'll notify you of your appt for the mammogram/US Your foot pain is tendonitis Start the Naproxen twice daily- take w/ food Elevate, ICE Get arch supports to put in your shoes while at work Call with any questions or concerns Happy Thanksgiving!!

## 2012-02-22 NOTE — Assessment & Plan Note (Signed)
New.  Pt w/ tender mass in L breast.  Due to hx of testicular cancer will get diagnostic mammo.  Pt expressed understanding and is in agreement w/ plan.

## 2012-02-22 NOTE — Assessment & Plan Note (Signed)
New.  No bony tenderness.  Pain and mild swelling consistent w/ tendonitis due to poor arch support.  Start scheduled NSAIDs, ice, elevation.  Recommended arch supports for his work shoes.  Pt expressed understanding and is in agreement w/ plan.

## 2012-02-23 ENCOUNTER — Telehealth: Payer: Self-pay | Admitting: Family Medicine

## 2012-02-23 NOTE — Telephone Encounter (Signed)
Patient Information:  Caller Name: Gavin Parker  Phone: [phone]  Patient: Gavin Parker, Gavin Parker  Gender: Male  DOB: December 29, 1965  Age: 46 Years  PCP: Gavin Parker.   Symptoms  Reason For Call & Symptoms: Patient is calling because he's noted bloody stools. Took Naproxyn x 10 doses for tendonitis. Also takes Asa 81. Today, he noticed a larger amount in stool. Bright red noted on paper and in toilet water. Does have hemorrhoids. Stools are brown with rred streaks.  Reviewed Health History In EMR: Yes  Reviewed Medications In EMR: Yes  Reviewed Allergies In EMR: Yes  Date of Onset of Symptoms: 01/17/2012  Treatments Tried: Halted the Naproxyn  Treatments Tried Worked: No  Guideline(s) Used:  Rectal Bleeding  Disposition Per Guideline:   Go to ED Now (or to Office with PCP Approval)  Reason For Disposition Reached:   Bloody, black, or tarry bowel movements  Advice Given:  N/A  Office Follow Up:  Does the office need to follow up with this patient?: Yes  Instructions For The Office: Please note comments about patient's complaints about Dr Beverely Low  RN Overrode Recommendation:  Go To U[facility]C.  Pt voices understanding of need to be seen and says he will try to get someone to drive him to the UC before they close today.  Rn advised pt to go to [facility] and he agrees but then he became angry and started complaining that each time he comes to see Dr Beverely Low, she has caused him further problems for which he must see another MD. He believes the reason he's having rectal bleeding is because of Dr Rennis Golden treatment of Naproxyn for his prior problems. Rn did offer teaching about causes of rectal bleeding but he still expresses his anger and says a Environmental health practitioner will be contacting Dr Beverely Low on his behalf. He was not rude to this Rn but did curse during his complaining. Notified Nancy at the office due to his complaints.

## 2012-02-23 NOTE — Telephone Encounter (Signed)
Called pt to f/u lmovm. SGJ, RN

## 2012-02-24 ENCOUNTER — Encounter: Payer: Self-pay | Admitting: Family Medicine

## 2012-02-24 ENCOUNTER — Telehealth: Payer: Self-pay | Admitting: Family Medicine

## 2012-02-24 NOTE — Telephone Encounter (Addendum)
Spoke to pt he states "several days ago I went to the bathroom and there were red streaks on the stool then it went away. Yesterday I went to the bathroom and there was red blood on the stool but then I wiped with paper and it was gone." I explained it could possibly have been hemorrhoids that were inflammed and bleeding. I asked the patient if he was treated at the UC last night, he said he did not seek additional medical treatment as he "did not feel it was necessary. Pt states he drank gatorade and ate rice soup last night and has not gone to the bathroom at all today. Advised to seek medical attention is bloody stools returned. He verbalized understanding. SGJ, RN

## 2012-02-24 NOTE — Telephone Encounter (Signed)
Pt returned my call while I was out of the office, left message on my voice mail. Returned pts call on his cell phone, again had to leave message on his voice mail to call with any questions or concerns.Marlinda Mike, RN

## 2012-02-24 NOTE — Telephone Encounter (Signed)
Patient dismissed from [facility] by Neena Rhymes MD, effective 02/24/2012. Dismissal letter sent out by certified / registered mail. rmf

## 2012-03-17 ENCOUNTER — Telehealth: Payer: Self-pay | Admitting: Cardiology

## 2012-03-17 NOTE — Telephone Encounter (Signed)
New Problem:     I called the patient in an attempt to schedule an appointment, and, was told by the patient that he was not insured and would prefer to wait until he was in a better position financially to schedule.

## 2012-04-27 ENCOUNTER — Encounter: Payer: Self-pay | Admitting: Family Medicine

## 2012-05-26 ENCOUNTER — Other Ambulatory Visit: Payer: Self-pay | Admitting: Gastroenterology

## 2012-05-26 DIAGNOSIS — R109 Unspecified abdominal pain: Secondary | ICD-10-CM

## 2012-05-29 DIAGNOSIS — K760 Fatty (change of) liver, not elsewhere classified: Secondary | ICD-10-CM | POA: Insufficient documentation

## 2012-06-02 ENCOUNTER — Other Ambulatory Visit: Payer: Self-pay

## 2012-09-20 ENCOUNTER — Encounter (HOSPITAL_COMMUNITY): Payer: Self-pay | Admitting: Emergency Medicine

## 2012-09-20 ENCOUNTER — Emergency Department (INDEPENDENT_AMBULATORY_CARE_PROVIDER_SITE_OTHER)
Admission: EM | Admit: 2012-09-20 | Discharge: 2012-09-20 | Disposition: A | Discharge status: Nursing Home (Includes ICF) | Payer: Managed Care, Other (non HMO) | Source: Home / Self Care | Attending: Emergency Medicine | Admitting: Emergency Medicine

## 2012-09-20 ENCOUNTER — Observation Stay (HOSPITAL_COMMUNITY)
Admission: EM | Admit: 2012-09-20 | Discharge: 2012-09-21 | Disposition: A | Discharge status: Home / Self Care | Payer: Managed Care, Other (non HMO) | Attending: Internal Medicine | Admitting: Internal Medicine

## 2012-09-20 ENCOUNTER — Encounter (HOSPITAL_COMMUNITY): Payer: Self-pay

## 2012-09-20 ENCOUNTER — Emergency Department (HOSPITAL_COMMUNITY): Payer: Managed Care, Other (non HMO)

## 2012-09-20 DIAGNOSIS — R059 Cough, unspecified: Secondary | ICD-10-CM

## 2012-09-20 DIAGNOSIS — R110 Nausea: Secondary | ICD-10-CM | POA: Insufficient documentation

## 2012-09-20 DIAGNOSIS — R197 Diarrhea, unspecified: Secondary | ICD-10-CM

## 2012-09-20 DIAGNOSIS — R05 Cough: Secondary | ICD-10-CM

## 2012-09-20 DIAGNOSIS — R209 Unspecified disturbances of skin sensation: Secondary | ICD-10-CM | POA: Insufficient documentation

## 2012-09-20 DIAGNOSIS — I209 Angina pectoris, unspecified: Secondary | ICD-10-CM

## 2012-09-20 DIAGNOSIS — M79609 Pain in unspecified limb: Secondary | ICD-10-CM | POA: Insufficient documentation

## 2012-09-20 DIAGNOSIS — E785 Hyperlipidemia, unspecified: Secondary | ICD-10-CM

## 2012-09-20 DIAGNOSIS — K625 Hemorrhage of anus and rectum: Secondary | ICD-10-CM

## 2012-09-20 DIAGNOSIS — K219 Gastro-esophageal reflux disease without esophagitis: Secondary | ICD-10-CM

## 2012-09-20 DIAGNOSIS — R0602 Shortness of breath: Secondary | ICD-10-CM | POA: Insufficient documentation

## 2012-09-20 DIAGNOSIS — R42 Dizziness and giddiness: Secondary | ICD-10-CM | POA: Insufficient documentation

## 2012-09-20 DIAGNOSIS — R079 Chest pain, unspecified: Principal | ICD-10-CM

## 2012-09-20 DIAGNOSIS — C6290 Malignant neoplasm of unspecified testis, unspecified whether descended or undescended: Secondary | ICD-10-CM

## 2012-09-20 DIAGNOSIS — R0609 Other forms of dyspnea: Secondary | ICD-10-CM | POA: Diagnosis present

## 2012-09-20 DIAGNOSIS — M549 Dorsalgia, unspecified: Secondary | ICD-10-CM | POA: Insufficient documentation

## 2012-09-20 DIAGNOSIS — L989 Disorder of the skin and subcutaneous tissue, unspecified: Secondary | ICD-10-CM

## 2012-09-20 DIAGNOSIS — J309 Allergic rhinitis, unspecified: Secondary | ICD-10-CM

## 2012-09-20 DIAGNOSIS — I2510 Atherosclerotic heart disease of native coronary artery without angina pectoris: Secondary | ICD-10-CM

## 2012-09-20 DIAGNOSIS — E039 Hypothyroidism, unspecified: Secondary | ICD-10-CM

## 2012-09-20 DIAGNOSIS — L819 Disorder of pigmentation, unspecified: Secondary | ICD-10-CM

## 2012-09-20 DIAGNOSIS — R61 Generalized hyperhidrosis: Secondary | ICD-10-CM | POA: Insufficient documentation

## 2012-09-20 DIAGNOSIS — K645 Perianal venous thrombosis: Secondary | ICD-10-CM

## 2012-09-20 DIAGNOSIS — N63 Unspecified lump in unspecified breast: Secondary | ICD-10-CM

## 2012-09-20 DIAGNOSIS — R0989 Other specified symptoms and signs involving the circulatory and respiratory systems: Secondary | ICD-10-CM

## 2012-09-20 DIAGNOSIS — R1030 Lower abdominal pain, unspecified: Secondary | ICD-10-CM

## 2012-09-20 HISTORY — DX: Angina pectoris, unspecified: I20.9

## 2012-09-20 LAB — PROTIME-INR: INR: 1.04 (ref 0.00–1.49)

## 2012-09-20 LAB — POCT I-STAT TROPONIN I: Troponin i, poc: 0.02 ng/mL (ref 0.00–0.08)

## 2012-09-20 LAB — CBC
Hemoglobin: 14.7 g/dL (ref 13.0–17.0)
MCH: 29.9 pg (ref 26.0–34.0)
MCV: 83.1 fL (ref 78.0–100.0)
RBC: 4.92 MIL/uL (ref 4.22–5.81)

## 2012-09-20 LAB — BASIC METABOLIC PANEL
BUN: 18 mg/dL (ref 6–23)
CO2: 29 mEq/L (ref 19–32)
Calcium: 9.8 mg/dL (ref 8.4–10.5)
Creatinine, Ser: 1.01 mg/dL (ref 0.50–1.35)
Glucose, Bld: 100 mg/dL — ABNORMAL HIGH (ref 70–99)

## 2012-09-20 LAB — APTT: aPTT: 166 seconds — ABNORMAL HIGH (ref 24–37)

## 2012-09-20 MED ORDER — LORATADINE 10 MG PO TABS
10.0000 mg | ORAL_TABLET | Freq: Every day | ORAL | Status: DC
  Administered 2012-09-21: 10 mg via ORAL
  Filled 2012-09-20: qty 1

## 2012-09-20 MED ORDER — ASPIRIN 81 MG PO CHEW
CHEWABLE_TABLET | ORAL | Status: AC
  Filled 2012-09-20: qty 4

## 2012-09-20 MED ORDER — PANTOPRAZOLE SODIUM 40 MG PO TBEC
40.0000 mg | DELAYED_RELEASE_TABLET | Freq: Every day | ORAL | Status: DC
  Administered 2012-09-21: 40 mg via ORAL
  Filled 2012-09-20: qty 1

## 2012-09-20 MED ORDER — NITROGLYCERIN 0.4 MG SL SUBL: 0.4000 mg | SUBLINGUAL_TABLET | SUBLINGUAL | Status: DC | PRN

## 2012-09-20 MED ORDER — NITROGLYCERIN 2 % TD OINT
1.0000 [in_us] | TOPICAL_OINTMENT | Freq: Once | TRANSDERMAL | Status: AC
  Administered 2012-09-20: 1 [in_us] via TOPICAL
  Filled 2012-09-20: qty 1

## 2012-09-20 MED ORDER — SODIUM CHLORIDE 0.9 % IV SOLN
INTRAVENOUS | Status: DC
  Administered 2012-09-20: 19:00:00 via INTRAVENOUS

## 2012-09-20 MED ORDER — HEPARIN BOLUS VIA INFUSION
4000.0000 [IU] | Freq: Once | INTRAVENOUS | Status: AC
  Administered 2012-09-20: 4000 [IU] via INTRAVENOUS

## 2012-09-20 MED ORDER — ASPIRIN 81 MG PO CHEW
324.0000 mg | CHEWABLE_TABLET | Freq: Once | ORAL | Status: AC
  Administered 2012-09-20: 324 mg via ORAL

## 2012-09-20 MED ORDER — MELOXICAM 7.5 MG PO TABS: 7.5000 mg | ORAL_TABLET | Freq: Every day | ORAL | Status: DC | PRN

## 2012-09-20 MED ORDER — ATORVASTATIN CALCIUM 40 MG PO TABS
40.0000 mg | ORAL_TABLET | Freq: Every day | ORAL | Status: DC
  Administered 2012-09-21: 40 mg via ORAL
  Filled 2012-09-20: qty 1

## 2012-09-20 MED ORDER — ASPIRIN 81 MG PO CHEW
81.0000 mg | CHEWABLE_TABLET | Freq: Every day | ORAL | Status: DC
  Administered 2012-09-21: 81 mg via ORAL
  Filled 2012-09-20 (×2): qty 1

## 2012-09-20 MED ORDER — HEPARIN SODIUM (PORCINE) 5000 UNIT/ML IJ SOLN
5000.0000 [IU] | Freq: Three times a day (TID) | INTRAMUSCULAR | Status: DC
  Administered 2012-09-21: 5000 [IU] via SUBCUTANEOUS
  Filled 2012-09-20 (×4): qty 1

## 2012-09-20 MED ORDER — LEVOTHYROXINE SODIUM 100 MCG PO TABS
100.0000 ug | ORAL_TABLET | Freq: Every day | ORAL | Status: DC
Start: 2012-09-21 — End: 2012-09-21
  Administered 2012-09-21: 100 ug via ORAL
  Filled 2012-09-20 (×2): qty 1

## 2012-09-20 MED ORDER — MONTELUKAST SODIUM 10 MG PO TABS
10.0000 mg | ORAL_TABLET | Freq: Every day | ORAL | Status: DC
  Administered 2012-09-21: 10 mg via ORAL
  Filled 2012-09-20 (×2): qty 1

## 2012-09-20 MED ORDER — SODIUM CHLORIDE 0.9 % IJ SOLN
3.0000 mL | Freq: Two times a day (BID) | INTRAMUSCULAR | Status: DC
  Administered 2012-09-21 (×2): 3 mL via INTRAVENOUS

## 2012-09-20 MED ORDER — DOXYCYCLINE HYCLATE 100 MG PO TABS
100.0000 mg | ORAL_TABLET | Freq: Two times a day (BID) | ORAL | Status: DC
  Filled 2012-09-20 (×3): qty 1

## 2012-09-20 MED ORDER — HEPARIN (PORCINE) IN NACL 100-0.45 UNIT/ML-% IJ SOLN
1250.0000 [IU]/h | INTRAMUSCULAR | Status: DC
  Administered 2012-09-20: 1250 [IU]/h via INTRAVENOUS
  Filled 2012-09-20: qty 250

## 2012-09-20 MED ORDER — FLUTICASONE PROPIONATE 50 MCG/ACT NA SUSP
1.0000 | Freq: Two times a day (BID) | NASAL | Status: DC | PRN
  Filled 2012-09-20: qty 16

## 2012-09-20 NOTE — H&P (Signed)
[facility] History and Physical  Argel Pablo ZOX:[id number] DOB: 1965-06-19 DOA: 09/20/2012  Referring physician: ED PCP: Dois Davenport., MD  Specialists: None  Chief Complaint: Chest pain  HPI: Gavin Parker is a 47 y.o. male who presents to the ED with c/o chest pain.  Symptoms have been going on for past 2 days.  Occasional radiation to L arm and shoulder.  Worse with exertion and has had SOB more than usual when walking up a flight of stairs today at work.  Patient also developed nausea today, he states he has had a cold recently and was put on doxycycline for this as well.  Heart cath last done 2 years ago showed 40% RCA and 40% LAD stenosis, had similar symptoms at that time.  Only known risk factor is h/o hyperlipidemia.  At [facility] today his CP completely resolved with NTG and he was sent to ED for further eval.  In the ED troponin was negative, EKG unremarkable, hospitalist asked to admit.  Review of Systems: 12 systems reviewed and otherwise negative.  Past Medical History  Diagnosis Date  . 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 Last visit w/ urology June 2011 ([place]); last CT of  the abdomen -chest in [place]:  10/11  . GERD (gastroesophageal reflux disease)   . Hyperlipidemia   . Hypothyroidism   . CAD (coronary artery disease)     non obstructive per cath 09-2010   Past Surgical History  Procedure Laterality Date  . Orchiectomy      R  . Umbilical hernia repair    . Tonsillectomy    . Cardiac catheterization     Social History:  reports that he has quit smoking. He has never used smokeless tobacco. He reports that  drinks alcohol. He reports that he does not use illicit drugs.   No Known Allergies  Family History  Problem Relation Age of Onset  . Arthritis Father   . Diabetes Mother   . Ovarian cancer Mother   . Pancreatic cancer Father   . Cancer Maternal Grandfather     metastatic      Prior to Admission medications    Medication Sig Start Date End Date Taking? Authorizing Provider  aspirin 81 MG tablet Take 81 mg by mouth daily.     Yes Historical Provider, MD  atorvastatin (LIPITOR) 40 MG tablet Take 40 mg by mouth daily.   Yes Historical Provider, MD  cetirizine (ZYRTEC) 10 MG tablet Take 1 tablet (10 mg total) by mouth daily. 07/23/10  Yes Wanda Plump, MD  doxycycline (VIBRA-TABS) 100 MG tablet Take 100 mg by mouth 2 (two) times daily.   Yes Historical Provider, MD  fluticasone (FLONASE) 50 MCG/ACT nasal spray Place 1 spray into the nose as directed. 1 spray in each nostril twice a day as needed. Use the "crossover" technique as discussed 11/22/10  Yes Pecola Lawless, MD  lansoprazole (PREVACID) 30 MG capsule Take 1 capsule (30 mg total) by mouth daily. 02/17/11  Yes Sheliah Hatch, MD  levothyroxine (SYNTHROID, LEVOTHROID) 100 MCG tablet Take 1 tablet (100 mcg total) by mouth daily. 02/13/12  Yes Sheliah Hatch, MD  meloxicam (MOBIC) 7.5 MG tablet Take 7.5 mg by mouth daily as needed for pain.   Yes Historical Provider, MD  montelukast (SINGULAIR) 10 MG tablet Take 10 mg by mouth at bedtime.   Yes Historical Provider, MD   Physical Exam: Filed Vitals:   09/20/12 2130 09/20/12  2200 09/20/12 2230 09/20/12 2300  BP: 115/76 98/66 111/79 98/68  Pulse: 59 63 60 62  Temp:      TempSrc:      Resp: 15 17 24 19   Height:      Weight:      SpO2: 99% 99% 100% 98%    General:  NAD, resting comfortably in bed Eyes: PEERLA EOMI ENT: mucous membranes moist Neck: supple w/o JVD Cardiovascular: RRR w/o MRG Respiratory: CTA B Abdomen: soft, nt, nd, bs+ Skin: no rash nor lesion Musculoskeletal: MAE, full ROM all 4 extremities Psychiatric: normal tone and affect Neurologic: AAOx3, grossly non-focal  Labs on Admission:  Basic Metabolic Panel:  Recent Labs Lab 09/20/12 2025  NA 137  K 4.7  CL 101  CO2 29  GLUCOSE 100*  BUN 18  CREATININE 1.01  CALCIUM 9.8   Liver Function Tests: No results  found for this basename: AST, ALT, ALKPHOS, BILITOT, PROT, ALBUMIN,  in the last 168 hours No results found for this basename: LIPASE, AMYLASE,  in the last 168 hours No results found for this basename: AMMONIA,  in the last 168 hours CBC:  Recent Labs Lab 09/20/12 2025  WBC 6.7  HGB 14.7  HCT 40.9  MCV 83.1  PLT 181   Cardiac Enzymes: No results found for this basename: CKTOTAL, CKMB, CKMBINDEX, TROPONINI,  in the last 168 hours  BNP (last 3 results) No results found for this basename: PROBNP,  in the last 8760 hours CBG: No results found for this basename: GLUCAP,  in the last 168 hours  Radiological Exams on Admission: Dg Chest Port 1 View  09/20/2012   *RADIOLOGY REPORT*  Clinical Data: Shortness of breath with chest and left arm pain for 3 days.  History of coronary artery disease.  PORTABLE CHEST - 1 VIEW  Comparison: CT 05/25/2012.  Radiographs 07/14/2011.  Findings: 2007 hours.  There are low lung volumes with new patchy bibasilar pulmonary opacities most consistent with atelectasis.  No consolidation or significant pleural effusion is seen.  Heart size and mediastinal contours are stable.  Telemetry leads overlie the chest.  IMPRESSION: Patchy bibasilar opacities associated with suboptimal inspiration, likely representing atelectasis.  Early aspiration cannot be excluded in the appropriate clinical context.   Original Report Authenticated By: Carey Bullocks, M.D.    EKG: Independently reviewed.  Assessment/Plan Principal Problem:   Chest pain Active Problems:   HYPERLIPIDEMIA   DYSPNEA ON EXERTION   CAD in native artery   1. Chest pain - serial troponins, tele monitor, needs cards eval in AM, likely needs stress test vs cath (TBD by cards), NPO at this time, chest pain resolved with NTG. 2. Hyperlipidemia - continue statin    Code Status: Full Code (must indicate code status--if unknown or must be presumed, indicate so) Family Communication: Spoke with wife at  bedside (indicate person spoken with, if applicable, with phone number if by telephone) Disposition Plan: Admit to obs(indicate anticipated LOS)  Time spent: 50 min  GARDNER, JARED M. [facility] Pager [phone]  If 7PM-7AM, please contact night-coverage [email or web address] Password [facility] 09/20/2012, 11:45 PM

## 2012-09-20 NOTE — ED Notes (Signed)
Patient transferred to C-28 awaiting for inpatient room assignment. Patient AAOx4, resp e/u, NAD and without CP at this time. Report given to [place], RN

## 2012-09-20 NOTE — ED Provider Notes (Signed)
History    CSN: [id number] Arrival date & time 09/20/12  1939  First MD Initiated Contact with Patient 09/20/12 2048     Chief Complaint  Patient presents with  . Chest Pain   (Consider location/radiation/quality/duration/timing/severity/associated sxs/prior Treatment) Patient is a 47 y.o. male presenting with chest pain. The history is provided by the patient.  Chest Pain He noted onset 2 days ago of an achy pain in his left wrist which would sometimes radiate up into his upper arm and shoulder. It was worse with exertion such as walking and better with rest. Yesterday, he started having some diarrhea. Today he developed nausea. Today, pain sometimes radiated up into his chest. It was never more severe than 5/10. Today, there was associated dyspnea and diaphoresis. He also has had some sensation like indigestion. He went to urgent care where he was given aspirin and nitroglycerin and his discomfort went away completely. He now has a very mild throbbing pain in his left upper arm. He had a catheterization done several years ago which did not show any critical blockages, and says that symptoms that he is having today is similar to what he had then. He does have a history of hyperlipidemia and is a nonsmoker. There is no history of hypertension or diabetes. Past Medical History  Diagnosis Date  . 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 Last visit w/ urology June 2011 ([place]); last CT of  the abdomen -chest in [place]:  10/11  . GERD (gastroesophageal reflux disease)   . Hyperlipidemia   . Hypothyroidism   . CAD (coronary artery disease)     non obstructive per cath 09-2010   Past Surgical History  Procedure Laterality Date  . Orchiectomy      R  . Umbilical hernia repair    . Tonsillectomy    . Cardiac catheterization     Family History  Problem Relation Age of Onset  . Arthritis Father   . Diabetes Mother   . Ovarian cancer Mother   . Pancreatic cancer  Father   . Cancer Maternal Grandfather     metastatic    History  Substance Use Topics  . Smoking status: Former Games developer  . Smokeless tobacco: Never Used  . Alcohol Use: Yes     Comment: 2 per week    Review of Systems  Cardiovascular: Positive for chest pain.  All other systems reviewed and are negative.    Allergies  Review of patient's allergies indicates no known allergies.  Home Medications   Current Outpatient Rx  Name  Route  Sig  Dispense  Refill  . aspirin 81 MG tablet   Oral   Take 81 mg by mouth daily.           Marland Kitchen atorvastatin (LIPITOR) 40 MG tablet   Oral   Take 40 mg by mouth daily.         . cetirizine (ZYRTEC) 10 MG tablet   Oral   Take 1 tablet (10 mg total) by mouth daily.   30 tablet   6   . doxycycline (VIBRA-TABS) 100 MG tablet   Oral   Take 100 mg by mouth 2 (two) times daily.         . fluticasone (FLONASE) 50 MCG/ACT nasal spray   Nasal   Place 1 spray into the nose as directed. 1 spray in each nostril twice a day as needed. Use the "crossover" technique as discussed         .  lansoprazole (PREVACID) 30 MG capsule   Oral   Take 1 capsule (30 mg total) by mouth daily.   30 capsule   6   . levothyroxine (SYNTHROID, LEVOTHROID) 100 MCG tablet   Oral   Take 1 tablet (100 mcg total) by mouth daily.   30 tablet   2   . meloxicam (MOBIC) 7.5 MG tablet   Oral   Take 7.5 mg by mouth daily as needed for pain.         . montelukast (SINGULAIR) 10 MG tablet   Oral   Take 10 mg by mouth at bedtime.          BP 109/72  Pulse 62  Temp(Src) 97.8 F (36.6 C) (Oral)  Resp 21  Ht 5\' 11"  (1.803 m)  Wt 235 lb (106.595 kg)  BMI 32.79 kg/m2  SpO2 97% Physical Exam  Nursing note and vitals reviewed.  47 year old male, resting comfortably and in no acute distress. Vital signs are significant for borderline tachypnea with respiratory rate of 21. Oxygen saturation is 97%, which is normal. Head is normocephalic and atraumatic.  PERRLA, EOMI. Oropharynx is clear. Neck is nontender and supple without adenopathy or JVD. Back is nontender and there is no CVA tenderness. Lungs are clear without rales, wheezes, or rhonchi. Chest is nontender. Heart has regular rate and rhythm without murmur. Abdomen is soft, flat, nontender without masses or hepatosplenomegaly and peristalsis is normoactive. Extremities have no cyanosis or edema, full range of motion is present. Skin is warm and dry without rash. Neurologic: Mental status is normal, cranial nerves are intact, there are no motor or sensory deficits.  ED Course  Procedures (including critical care time) Results for orders placed during the hospital encounter of 09/20/12  CBC      Result Value Range   WBC 6.7  4.0 - 10.5 K/uL   RBC 4.92  4.22 - 5.81 MIL/uL   Hemoglobin 14.7  13.0 - 17.0 g/dL   HCT 54.0  98.1 - 19.1 %   MCV 83.1  78.0 - 100.0 fL   MCH 29.9  26.0 - 34.0 pg   MCHC 35.9  30.0 - 36.0 g/dL   RDW 47.8  29.5 - 62.1 %   Platelets 181  150 - 400 K/uL  BASIC METABOLIC PANEL      Result Value Range   Sodium 137  135 - 145 mEq/L   Potassium 4.7  3.5 - 5.1 mEq/L   Chloride 101  96 - 112 mEq/L   CO2 29  19 - 32 mEq/L   Glucose, Bld 100 (*) 70 - 99 mg/dL   BUN 18  6 - 23 mg/dL   Creatinine, Ser 3.08  0.50 - 1.35 mg/dL   Calcium 9.8  8.4 - 65.7 mg/dL   GFR calc non Af Amer 86 (*) >90 mL/min   GFR calc Af Amer >90  >90 mL/min  PROTIME-INR      Result Value Range   Prothrombin Time 13.5  11.6 - 15.2 seconds   INR 1.04  0.00 - 1.49  APTT      Result Value Range   aPTT 166 (*) 24 - 37 seconds  TROPONIN I      Result Value Range   Troponin I <0.30  <0.30 ng/mL  POCT I-STAT TROPONIN I      Result Value Range   Troponin i, poc 0.02  0.00 - 0.08 ng/mL   Comment 3  Dg Chest Port 1 View  09/20/2012   *RADIOLOGY REPORT*  Clinical Data: Shortness of breath with chest and left arm pain for 3 days.  History of coronary artery disease.  PORTABLE CHEST  - 1 VIEW  Comparison: CT 05/25/2012.  Radiographs 07/14/2011.  Findings: 2007 hours.  There are low lung volumes with new patchy bibasilar pulmonary opacities most consistent with atelectasis.  No consolidation or significant pleural effusion is seen.  Heart size and mediastinal contours are stable.  Telemetry leads overlie the chest.  IMPRESSION: Patchy bibasilar opacities associated with suboptimal inspiration, likely representing atelectasis.  Early aspiration cannot be excluded in the appropriate clinical context.   Original Report Authenticated By: Carey Bullocks, M.D.     Date: 09/20/2012  Rate: 56  Rhythm: sinus bradycardia  QRS Axis: normal  Intervals: normal  ST/T Wave abnormalities: normal  Conduction Disutrbances:none  Narrative Interpretation: RSR' in V1 which may indicate incomplete right palmar branch block or right ventricular conduction delay or right ventricular hypertrophy. When compared with ECG done earlier today at urgent care and also with ECG done 10/22/2010, no significant changes are seen.  Old EKG Reviewed: unchanged    1. Chest pain   2. CAD in native artery     MDM  Exertional pain in his left arm which is worrisome for angina pectoris. Response to nitroglycerin is also worrisome. Old records are reviewed and he had a catheterization done on 10/25/2010 which did show 40% lesions in the right coronary artery and in the LAD. He'll be started on heparin and given topical nitroglycerin and he will need to be admitted for serial troponins and consideration for repeat cardiac catheterization. Case is discussed with Dr. Julian Reil of [facility] who requests the patient be admitted to cardiology. Case is discussed with cardiology fellow who requests that the patient be admitted to medicine with cardiology consultation. The position as discussed the case together and Dr. Julian Reil has agreed to admit the patient.  Dione Booze, MD 09/21/12 [phone]

## 2012-09-20 NOTE — ED Notes (Signed)
Reports left arm pain initially on Saturday, then noticed chest pain.  Pain has been intermittent.  Also noticed nausea yesterday.  Today went to work, reports feeling lightheaded and intermittently nauseated.  Also reports palpitations, no chest pain currently.

## 2012-09-20 NOTE — ED Notes (Signed)
Call to EMS for transport; will send truck ASAP

## 2012-09-20 NOTE — ED Notes (Signed)
MD at bedside. 

## 2012-09-20 NOTE — Progress Notes (Signed)
ANTICOAGULATION CONSULT NOTE - Initial Consult  Pharmacy Consult for heparin Indication: chest pain/ACS  No Known Allergies  Patient Measurements: Height: 5\' 11"  (180.3 cm) Weight: 235 lb (106.595 kg) IBW/kg (Calculated) : 75.3 Heparin Dosing Weight: 88  Vital Signs: Temp: 97.8 F (36.6 C) (06/23 1956) Temp src: Oral (06/23 1956) BP: 115/64 mmHg (06/23 2100) Pulse Rate: 59 (06/23 2100)  Labs:  Recent Labs  09/20/12 2025  HGB 14.7  HCT 40.9  PLT 181  CREATININE 1.01    Estimated Creatinine Clearance: 111.1 ml/min (by C-G formula based on Cr of 1.01).   Medical History: Past Medical History  Diagnosis Date  . 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 Last visit w/ urology June 2011 ([place]); last CT of  the abdomen -chest in [place]:  10/11  . GERD (gastroesophageal reflux disease)   . Hyperlipidemia   . Hypothyroidism   . CAD (coronary artery disease)     non obstructive per cath 09-2010    Assessment: 47 year old male transferred from urgent care for chest pain, rule out ACS. Patient does have a history of CAD and had a cath 2 years ago. Orders to start IV heparin. Baseline cbc within normal limits.  Goal of Therapy:  Heparin level 0.3-0.7 units/ml Monitor platelets by anticoagulation protocol: Yes   Plan:  Give 4000 units bolus x 1 Start heparin infusion at 1250 units/hr Check anti-Xa level in 6 hours and daily while on heparin Continue to monitor H&H and platelets  Sheppard Coil PharmD., BCPS Clinical Pharmacist Pager [phone] 09/20/2012 9:39 PM

## 2012-09-20 NOTE — ED Notes (Signed)
Admitting MD at bedside.

## 2012-09-20 NOTE — ED Provider Notes (Signed)
Chief Complaint:  No chief complaint on file.   History of Present Illness:   Gavin Parker is a 47 year old male with a history of coronary artery disease and hyperlipidemia who presents with a three-day history of left submammary chest pain lasting minutes at a time. This radiates through to his back and he has also had some throbbing and tingling discomfort in his left arm and hand as well. The pain is worse when he was working. It's been associated with feeling lightheaded, shortness of breath, wheezing, nausea, sweats, postnasal drip, and some diarrhea. He denies any fever or chills. He's had no coughing. He denies any palpitations or syncope. He's had no abdominal pain. The patient had a history of similar pain about 2 years ago and he underwent a heart catheterization done by Dr. Olga Millers. He was found to have some 40% stenoses. He's not been back for followup since that.  Review of Systems:  Other than noted above, the patient denies any of the following symptoms. Systemic:  No fever, chills, sweats, or fatigue. ENT:  No nasal congestion, rhinorrhea, or sore throat. Pulmonary:  No cough, wheezing, shortness of breath, sputum production, hemoptysis. Cardiac:  No palpitations, rapid heartbeat, dizziness, presyncope or syncope. GI:  No abdominal pain, heartburn, nausea, or vomiting. Ext:  No leg pain or swelling.  PMFSH:  Past medical history, family history, social history, meds, and allergies were reviewed and updated as needed. He has a history of testicular cancer. He takes Singulair, low thyroxine, aspirin, Zyrtec, lansoprazole, Flonase, and Lipitor.  Physical Exam:   Vital signs:  BP 130/87  Pulse 74  Temp(Src) 98 F (36.7 C) (Oral)  Resp 20  SpO2 100% Gen:  Alert, oriented, in no distress, skin warm and dry. Eye:  PERRL, lids and conjunctivas normal.  Sclera non-icteric. ENT:  Mucous membranes moist, pharynx clear. Neck:  Supple, no adenopathy or tenderness.  No  JVD. Lungs:  Clear to auscultation, no wheezes, rales or rhonchi.  No respiratory distress. Heart:  Regular rhythm.  No gallops, murmers, clicks or rubs. Chest:  No chest wall tenderness. Abdomen:  Soft, nontender, no organomegaly or mass.  Bowel sounds normal.  No pulsatile abdominal mass or bruit. Ext:  No edema.  No calf tenderness and Homann's sign negative.  Pulses full and equal. Skin:  Warm and dry.  No rash.  EKG:   Date: 09/20/2012  Rate: 65  Rhythm: normal sinus rhythm  QRS Axis: normal  Intervals: normal  ST/T Wave abnormalities: normal  Conduction Disutrbances:none  Narrative Interpretation: Normal sinus rhythm, normal EKG.  Old EKG Reviewed: none available  Course in Urgent Care Center:   He was given aspirin 325 mg by mouth and nitroglycerin 0.4 mg sublingually and he was started on normal saline at 50 mL per hour and will be transferred to the emergency department via [facility].  Assessment:  The encounter diagnosis was Angina pectoris.   Plan:   1.  The following meds were prescribed:   New Prescriptions   No medications on file   2.  The patient was transferred to the emergency department via [facility] in stable condition.   Reuben Likes, MD 09/20/12 [phone]

## 2012-09-20 NOTE — ED Notes (Signed)
Pt presents via EMS from Urgent Care. Has had a three-day history of intermittent, episodic left-sided chest pain that lasts minutes at a time. Pain radiates through to his back. Also has had some throbbing and tingling discomfort in his left arm and hand. The pain is worse when he was working and is reproducible with palpation. Rates 5/10.  It's been associated with feeling lightheaded, shortness of breath, wheezing, nausea, sweats, postnasal drip, and some diarrhea. He denies any fever or chills. He's had no coughing. He denies any palpitations or syncope. No abd pain. At [facility], 12-lead EKG unremarkable and received ASA 324 mg. Nitro 0.4 mg x 1 with EMS. To note, patient had cardiac cath 2 years ago which revealed 40% left and 40% right stenosis. Hx CAD, GERD, HLD and hypothyroid.

## 2012-09-21 ENCOUNTER — Observation Stay (HOSPITAL_COMMUNITY): Payer: Managed Care, Other (non HMO)

## 2012-09-21 ENCOUNTER — Encounter (HOSPITAL_COMMUNITY): Payer: Self-pay | Admitting: General Practice

## 2012-09-21 DIAGNOSIS — K219 Gastro-esophageal reflux disease without esophagitis: Secondary | ICD-10-CM

## 2012-09-21 DIAGNOSIS — R079 Chest pain, unspecified: Secondary | ICD-10-CM

## 2012-09-21 DIAGNOSIS — J019 Acute sinusitis, unspecified: Secondary | ICD-10-CM

## 2012-09-21 DIAGNOSIS — E785 Hyperlipidemia, unspecified: Secondary | ICD-10-CM

## 2012-09-21 LAB — LIPID PANEL
HDL: 33 mg/dL — ABNORMAL LOW (ref >39)
Triglycerides: 143 mg/dL (ref <150)

## 2012-09-21 LAB — BASIC METABOLIC PANEL
BUN: 15 mg/dL (ref 6–23)
CO2: 28 mEq/L (ref 19–32)
Chloride: 102 mEq/L (ref 96–112)
Glucose, Bld: 101 mg/dL — ABNORMAL HIGH (ref 70–99)
Potassium: 4.1 mEq/L (ref 3.5–5.1)
Sodium: 138 mEq/L (ref 135–145)

## 2012-09-21 LAB — D-DIMER, QUANTITATIVE: D-Dimer, Quant: 0.27 ug/mL-FEU (ref 0.00–0.48)

## 2012-09-21 LAB — CBC
HCT: 39.9 % (ref 39.0–52.0)
Hemoglobin: 14.4 g/dL (ref 13.0–17.0)
MCHC: 36.1 g/dL — ABNORMAL HIGH (ref 30.0–36.0)
RBC: 4.78 MIL/uL (ref 4.22–5.81)
WBC: 6.3 10*3/uL (ref 4.0–10.5)

## 2012-09-21 LAB — TROPONIN I
Troponin I: <0.3 ng/mL (ref <0.30)
Troponin I: <0.3 ng/mL (ref <0.30)

## 2012-09-21 MED ORDER — REGADENOSON 0.4 MG/5ML IV SOLN
0.4000 mg | Freq: Once | INTRAVENOUS | Status: AC
  Administered 2012-09-21: 0.4 mg via INTRAVENOUS
  Filled 2012-09-21: qty 5

## 2012-09-21 MED ORDER — ACETAMINOPHEN 325 MG PO TABS
650.0000 mg | ORAL_TABLET | ORAL | Status: DC | PRN
  Administered 2012-09-21: 650 mg via ORAL
  Filled 2012-09-21: qty 2

## 2012-09-21 MED ORDER — SODIUM CHLORIDE 0.9 % IV SOLN: INTRAVENOUS | Status: DC

## 2012-09-21 MED ORDER — NITROGLYCERIN 0.4 MG SL SUBL: 0.4000 mg | SUBLINGUAL_TABLET | SUBLINGUAL | Status: DC | PRN

## 2012-09-21 MED ORDER — TECHNETIUM TC 99M SESTAMIBI GENERIC - CARDIOLITE
10.0000 | Freq: Once | INTRAVENOUS | Status: AC | PRN
  Administered 2012-09-21: 10 via INTRAVENOUS

## 2012-09-21 MED ORDER — TECHNETIUM TC 99M SESTAMIBI GENERIC - CARDIOLITE
30.0000 | Freq: Once | INTRAVENOUS | Status: AC | PRN
  Administered 2012-09-21: 30 via INTRAVENOUS

## 2012-09-21 NOTE — Progress Notes (Signed)
Utilization review completed.  

## 2012-09-21 NOTE — Discharge Summary (Signed)
Physician Discharge Summary  Patient ID: Gavin Parker MRN: [id number] DOB/AGE: 08-13-1965 47 y.o.  Admit date: 09/20/2012 Discharge date: 09/21/2012  Primary Care Physician:  Dois Davenport., MD  Discharge Diagnoses:     . CAD in native artery . Chest pain- resolved . DYSPNEA ON EXERTION . HYPERLIPIDEMIA  Consults:  [place] [facility] for stress test   Allergies:  No Known Allergies   Discharge Medications:   Medication List    TAKE these medications       aspirin 81 MG tablet  Take 81 mg by mouth daily.     atorvastatin 40 MG tablet  Commonly known as:  LIPITOR  Take 40 mg by mouth daily.     cetirizine 10 MG tablet  Commonly known as:  ZYRTEC  Take 1 tablet (10 mg total) by mouth daily.     doxycycline 100 MG tablet  Commonly known as:  VIBRA-TABS  Take 100 mg by mouth 2 (two) times daily.     fluticasone 50 MCG/ACT nasal spray  Commonly known as:  FLONASE  Place 1 spray into the nose as directed. 1 spray in each nostril twice a day as needed. Use the "crossover" technique as discussed     lansoprazole 30 MG capsule  Commonly known as:  PREVACID  Take 1 capsule (30 mg total) by mouth daily.     levothyroxine 100 MCG tablet  Commonly known as:  SYNTHROID, LEVOTHROID  Take 1 tablet (100 mcg total) by mouth daily.     meloxicam 7.5 MG tablet  Commonly known as:  MOBIC  Take 7.5 mg by mouth daily as needed for pain.     montelukast 10 MG tablet  Commonly known as:  SINGULAIR  Take 10 mg by mouth at bedtime.     nitroGLYCERIN 0.4 MG SL tablet  Commonly known as:  NITROSTAT  Place 1 tablet (0.4 mg total) under the tongue every 5 (five) minutes as needed for chest pain (or arm pain).         Brief H and P: For complete details please refer to admission H and P, but in brief patient is a 47 year old male who presented to ED with chest pain, intermittent for last 2 days with occasional radiation to the left arm and shoulder, worse with exertion.  Patient also had a nausea also had cold recently and was put on doxycycline for this as well. He had some shortness of breath more than usual, heart cath done 2 years ago had shown 40% RCA and 40% LAD stenosis. Patient was admitted for chest pain rule out   Hospital Course:  Patient is a 47 year old male with hyperlipidemia, GERD, hypothyroidism who had cardiac cath in 2002 and had nonobstructive disease. Patient presented with chest pain. He was ruled out for acute ACS with negative cardiac enzymes. D-dimer was 0.27. Patient underwent a medicine stress test, which showed EF of 61% with no evidence of ischemia or infarction in any vascular territory. Patient was continued on aspirin, statin, S/L NTG prn. Patient may have had doxycycline induced esophagitis, he will continue PPI.    Day of Discharge BP 129/79  Pulse 57  Temp(Src) 98 F (36.7 C) (Oral)  Resp 18  Ht 5\' 11"  (1.803 m)  Wt 106.595 kg (235 lb)  BMI 32.79 kg/m2  SpO2 96%  Physical Exam: General: Alert and awake oriented x3 not in any acute distress. HEENT: anicteric sclera, pupils reactive to light and accommodation CVS: S1-S2 clear no murmur rubs or gallops Chest:  clear to auscultation bilaterally, no wheezing rales or rhonchi Abdomen: soft nontender, nondistended, normal bowel sounds, no organomegaly Extremities: no cyanosis, clubbing or edema noted bilaterally Neuro: Cranial nerves II-XII intact, no focal neurological deficits   The results of significant diagnostics from this hospitalization (including imaging, microbiology, ancillary and laboratory) are listed below for reference.    LAB RESULTS: Basic Metabolic Panel:  Recent Labs Lab 09/20/12 2025 09/21/12 0530  NA 137 138  K 4.7 4.1  CL 101 102  CO2 29 28  GLUCOSE 100* 101*  BUN 18 15  CREATININE 1.01 1.14  CALCIUM 9.8 9.3   Liver Function Tests: No results found for this basename: AST, ALT, ALKPHOS, BILITOT, PROT, ALBUMIN,  in the last 168 hours No  results found for this basename: LIPASE, AMYLASE,  in the last 168 hours No results found for this basename: AMMONIA,  in the last 168 hours CBC:  Recent Labs Lab 09/20/12 2025 09/21/12 0530  WBC 6.7 6.3  HGB 14.7 14.4  HCT 40.9 39.9  MCV 83.1 83.5  PLT 181 179   Cardiac Enzymes:  Recent Labs Lab 09/21/12 0530 09/21/12 1409  TROPONINI <0.30 <0.30   BNP: No components found with this basename: POCBNP,  CBG: No results found for this basename: GLUCAP,  in the last 168 hours  Significant Diagnostic Studies:  Nm Myocar Multi W/spect W/wall Motion / Ef  09/21/2012   47 year old male complaining of chest pain.  This study is performed to exclude ischemia.  This is a same day rest stress protocol.  30 mCi of Myoview were used for the stress images and 10 mCi of Myoview were used for the rest images.  Lexiscan was administered in the typical fashion.  The patients resting heart rate was 59.  Following infusion it was 81.  The blood pressure increased to a maximum of 138/80.  The patient denied chest pain.  There was T-wave inversion but no ST depression with the infusion.  The study was terminated per protocol.  Scintigraphic results:  The images were reconstructed in the short axis as well as the vertical and horizontal long axis.  There was no evidence of hypoperfusion in any vascular territory.  The gated ejection fraction was 61% and the wall motion was normal.  T I D - 1.55.  End-systolic volume - 50 ml.  End-diastolic volume 127 ml.  Final interpretation:  Lexiscan Myoview with no chest pain and no diagnostic electrocardiographic changes.  The scintigraphic results show no evidence of ischemia or infarction in any vascular territory.  The gated ejection fraction was 61% and the wall motion was normal.  Normal study.   Original Report Authenticated By: Olga Millers   Dg Chest Port 1 View  09/20/2012   *RADIOLOGY REPORT*  Clinical Data: Shortness of breath with chest and left arm pain  for 3 days.  History of coronary artery disease.  PORTABLE CHEST - 1 VIEW  Comparison: CT 05/25/2012.  Radiographs 07/14/2011.  Findings: 2007 hours.  There are low lung volumes with new patchy bibasilar pulmonary opacities most consistent with atelectasis.  No consolidation or significant pleural effusion is seen.  Heart size and mediastinal contours are stable.  Telemetry leads overlie the chest.  IMPRESSION: Patchy bibasilar opacities associated with suboptimal inspiration, likely representing atelectasis.  Early aspiration cannot be excluded in the appropriate clinical context.   Original Report Authenticated By: Carey Bullocks, M.D.    Disposition and Follow-up:     Discharge Orders   Future Orders Complete  By Expires     Diet - low sodium heart healthy  As directed     Increase activity slowly  As directed         DISPOSITION: Home  DIET: Heart healthy diet  ACTIVITY: As tolerated    DISCHARGE FOLLOW-UP Follow-up Information   Follow up with Nadyne Coombes L., MD. Schedule an appointment as soon as possible for a visit in 2 weeks. (for hospital follow-up)    Contact information:   [address] [place] [address] [phone]       Time spent on Discharge: 35 mins  Signed:   RAI,RIPUDEEP M.D. [facility] 09/21/2012, 3:41 PM Pager: [phone]

## 2012-09-21 NOTE — ED Notes (Addendum)
Pt reports intermittent left sided chest pain (lasting less than 1 minute) since Saturday afternoon radiating to left arm (constant in nature) associated with SOB, nausea, and palpitations that started today. At this time pt is CP free and denies nausea and sob. Pt is aware of POC. Pt given happy meal, aware NPO after midnight. Pt also reports he had nasal drip with no cough that started today. Denies abdominal pain.

## 2013-02-03 ENCOUNTER — Other Ambulatory Visit: Payer: Self-pay

## 2013-08-29 ENCOUNTER — Ambulatory Visit (INDEPENDENT_AMBULATORY_CARE_PROVIDER_SITE_OTHER): Payer: 59 | Admitting: Cardiology

## 2013-08-29 ENCOUNTER — Encounter: Payer: Self-pay | Admitting: Cardiology

## 2013-08-29 VITALS — BP 128/78 | HR 71 | Ht 71.0 in | Wt 231.0 lb

## 2013-08-29 DIAGNOSIS — I2510 Atherosclerotic heart disease of native coronary artery without angina pectoris: Secondary | ICD-10-CM

## 2013-08-29 DIAGNOSIS — E039 Hypothyroidism, unspecified: Secondary | ICD-10-CM

## 2013-08-29 DIAGNOSIS — I209 Angina pectoris, unspecified: Secondary | ICD-10-CM

## 2013-08-29 DIAGNOSIS — I208 Other forms of angina pectoris: Secondary | ICD-10-CM

## 2013-08-29 DIAGNOSIS — R079 Chest pain, unspecified: Secondary | ICD-10-CM

## 2013-08-29 NOTE — Patient Instructions (Signed)
Your physician has requested that you have en exercise stress myoview. For further information please visit [email or web address]. Please follow instruction sheet, as given. You will be called with the results.

## 2013-08-29 NOTE — Progress Notes (Signed)
08/29/2013   PCP: Hayden Rasmussen., MD   Chief Complaint  Patient presents with  . Follow-up    pt was over due for his appt    Primary Cardiologist: Dr. Stanford Breed  HPI:  Last visit 12/05/10 but stress test 17/1281.  48 year old white married male presents today with increasing episodes of chest pressure. Started several weeks ago he was short of breath then he developed heaviness with exertion initially only shortness of breath.  Now with mowing the grass or worse in the car he develops chest heaviness twice he take a nitroglycerin sublingual with relief of the symptoms. He does have a history of coronary disease with 20% mid LAD disease followed by a 40% lesion and a 20% distal LAD lesion. Calcified 40% proximal RCA lesion and normal LV function on cardiac catheterization in July of 2012.  Initially he thought the symptoms were related to allergies he saw his primary care they placed him on antibiotic and steroid Dosepak and if symptoms continue.  He does have shortness of breath with the pain in has had more frequent episodes of diaphoresis as well.  Allergies  Allergen Reactions  . Atorvastatin Itching  . Doxycycline Diarrhea    Nausea, diarrhea  . Rosuvastatin Other (See Comments)    Muscle pain    Current Outpatient Prescriptions  Medication Sig Dispense Refill  . albuterol (VENTOLIN HFA) 108 (90 BASE) MCG/ACT inhaler Inhale 2 puffs into the lungs every 4 (four) hours as needed.      . Alum & Mag Hydroxide-Simeth (MAGIC MOUTHWASH) SOLN Take 5 mLs by mouth as needed for mouth pain.      Marland Kitchen aspirin 81 MG tablet Take 81 mg by mouth daily.        Marland Kitchen azithromycin (ZITHROMAX) 500 MG tablet Take 500 mg by mouth daily.      . cetirizine (ZYRTEC) 10 MG tablet Take 1 tablet (10 mg total) by mouth daily.  30 tablet  6  . fluticasone (FLONASE) 50 MCG/ACT nasal spray 1 spray in each nostril twice a day as needed. Use the "crossover" technique as discussed       . lansoprazole  (PREVACID) 30 MG capsule Take 1 capsule (30 mg total) by mouth daily.  30 capsule  6  . levothyroxine (SYNTHROID, LEVOTHROID) 100 MCG tablet Take 1 tablet (100 mcg total) by mouth daily.  30 tablet  2  . montelukast (SINGULAIR) 10 MG tablet Take 10 mg by mouth at bedtime.      . mupirocin ointment (BACTROBAN) 2 % Apply 1 application topically 4 (four) times daily.      . nitroGLYCERIN (NITROSTAT) 0.4 MG SL tablet Place 1 tablet (0.4 mg total) under the tongue every 5 (five) minutes as needed for chest pain (or arm pain).  30 tablet  12  . predniSONE (DELTASONE) 10 MG tablet Take 10 mg by mouth as directed. 12 day pack      . simvastatin (ZOCOR) 40 MG tablet Take 40 mg by mouth every evening.       No current facility-administered medications for this visit.    Past Medical History  Diagnosis Date  . 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 Last visit w/ urology June 2011 ([place]); last CT of  the abdomen -chest in [place]:  10/11  . GERD (gastroesophageal reflux disease)   . Hyperlipidemia   . Hypothyroidism   . CAD (coronary artery disease)  non obstructive per cath 09-2010  . Anginal pain     Past Surgical History  Procedure Laterality Date  . Orchiectomy      R  . Umbilical hernia repair    . Tonsillectomy    . Cardiac catheterization      OZH:YQMVHQI:ON colds or fevers, no weight changes, positive allergy symptoms on Zithromax and steroid Dosepak Skin:no rashes or ulcers HEENT:no blurred vision, no congestion CV:see HPI PUL:see HPI GI:no diarrhea constipation or melena, no indigestion GU:no hematuria, no dysuria MS:no joint pain, no claudication Neuro:no syncope, no lightheadedness Endo:no diabetes, + thyroid disease, in December his TSH was 2.32.  PHYSICAL EXAM BP 128/78  Pulse 71  Ht 5\' 11"  (1.803 m)  Wt 231 lb (104.781 kg)  BMI 32.23 kg/m2 General:Pleasant affect, NAD Skin:Warm and dry, brisk capillary refill HEENT:normocephalic, sclera  clear, mucus membranes moist Neck:supple, no JVD, no bruits  Heart:S1S2 RRR without murmur, gallup, rub or click Lungs:clear without rales, rhonchi, or wheezes GEX:BMWU, non tender, + BS, do not palpate liver spleen or masses Ext:no lower ext edema, 2+ pedal pulses, 2+ radial pulses Neuro:alert and oriented, MAE, follows commands, + facial symmetry  EKG: SR no acute changes from 2014.  ASSESSMENT AND PLAN Chest pain with exertion Episodes of exertional chest pain, he has taken nitroglycerin with relief. They're associated with shortness of breath at times and he has had more diaphoresis.  We'll do an exercise Myoview to evaluate for ischemia if no further episodes we'll call him the results of your followup with Dr. Stanford Breed in 6 months if he continues with episodes he'll need to see Dr. Stanford Breed before that time.  CAD in native artery History of cardiac cath in July 2012 with 20% mid LAD followed by a 40% lesion 20% distal LAD lesion and 40% proximal RCA normal LV function.  HYPOTHYROIDISM December his TSH was 2.134.

## 2013-08-29 NOTE — Assessment & Plan Note (Signed)
History of cardiac cath in July 2012 with 20% mid LAD followed by a 40% lesion 20% distal LAD lesion and 40% proximal RCA normal LV function.

## 2013-08-29 NOTE — Assessment & Plan Note (Signed)
Episodes of exertional chest pain, he has taken nitroglycerin with relief. They're associated with shortness of breath at times and he has had more diaphoresis.  We'll do an exercise Myoview to evaluate for ischemia if no further episodes we'll call him the results of your followup with Dr. Stanford Breed in 6 months if he continues with episodes he'll need to see Dr. Stanford Breed before that time.

## 2013-08-29 NOTE — Assessment & Plan Note (Signed)
December his TSH was 2.134.

## 2013-09-01 ENCOUNTER — Telehealth (HOSPITAL_COMMUNITY): Payer: Self-pay

## 2013-09-08 ENCOUNTER — Ambulatory Visit (HOSPITAL_COMMUNITY)
Admission: RE | Admit: 2013-09-08 | Discharge: 2013-09-08 | Disposition: A | Discharge status: Home / Self Care | Payer: 59 | Source: Ambulatory Visit | Attending: Cardiology | Admitting: Cardiology

## 2013-09-08 DIAGNOSIS — R002 Palpitations: Secondary | ICD-10-CM | POA: Insufficient documentation

## 2013-09-08 DIAGNOSIS — R0602 Shortness of breath: Secondary | ICD-10-CM | POA: Insufficient documentation

## 2013-09-08 DIAGNOSIS — R0989 Other specified symptoms and signs involving the circulatory and respiratory systems: Secondary | ICD-10-CM | POA: Insufficient documentation

## 2013-09-08 DIAGNOSIS — R0609 Other forms of dyspnea: Secondary | ICD-10-CM | POA: Insufficient documentation

## 2013-09-08 DIAGNOSIS — R5383 Other fatigue: Secondary | ICD-10-CM

## 2013-09-08 DIAGNOSIS — I208 Other forms of angina pectoris: Secondary | ICD-10-CM

## 2013-09-08 DIAGNOSIS — E669 Obesity, unspecified: Secondary | ICD-10-CM | POA: Insufficient documentation

## 2013-09-08 DIAGNOSIS — J45909 Unspecified asthma, uncomplicated: Secondary | ICD-10-CM | POA: Insufficient documentation

## 2013-09-08 DIAGNOSIS — R5381 Other malaise: Secondary | ICD-10-CM | POA: Insufficient documentation

## 2013-09-08 DIAGNOSIS — I209 Angina pectoris, unspecified: Secondary | ICD-10-CM

## 2013-09-08 DIAGNOSIS — R079 Chest pain, unspecified: Secondary | ICD-10-CM | POA: Insufficient documentation

## 2013-09-08 DIAGNOSIS — Z87891 Personal history of nicotine dependence: Secondary | ICD-10-CM | POA: Insufficient documentation

## 2013-09-08 DIAGNOSIS — I2510 Atherosclerotic heart disease of native coronary artery without angina pectoris: Secondary | ICD-10-CM | POA: Insufficient documentation

## 2013-09-08 MED ORDER — TECHNETIUM TC 99M SESTAMIBI GENERIC - CARDIOLITE
10.0000 | Freq: Once | INTRAVENOUS | Status: AC | PRN
  Administered 2013-09-08: 10 via INTRAVENOUS

## 2013-09-08 MED ORDER — TECHNETIUM TC 99M SESTAMIBI GENERIC - CARDIOLITE
30.0000 | Freq: Once | INTRAVENOUS | Status: AC | PRN
  Administered 2013-09-08: 30 via INTRAVENOUS

## 2013-09-08 NOTE — Procedures (Addendum)
[place] [address] [place] [address] [phone]  Cardiology Nuclear Med Study  Gavin Parker is a 48 y.o. male     MRN : [id number]     DOB: July 15, 1965  Procedure Date: 09/08/2013  Nuclear Med Background Indication for Stress Test:  Evaluation for Ischemia History:  Asthma and CAD;Last NUC MPI on 09/21/2012-nonischemic;EF=61% Cardiac Risk Factors: History of Smoking, Lipids and Obesity  Symptoms:  Chest Pain, Diaphoresis, DOE, Fatigue, Palpitations and SOB   Nuclear Pre-Procedure Caffeine/Decaff Intake:  7:00pm NPO After: 5:00am   IV Site: R Forearm  IV 0.9% NS with Angio Cath:  22g  Chest Size (in):  52"  IV Started by: Rolene Course, RN  Height: 5\' 11"  (1.803 m)  Cup Size: n/a  BMI:  Body mass index is 32.23 kg/(m^2). Weight:  231 lb (104.781 kg)   Tech Comments:  n/a    Nuclear Med Study 1 or 2 day study: 1 day  Stress Test Type:  Stress  Order Authorizing Provider:  Cecilie Kicks, [facility]   Resting Radionuclide: Technetium 40m Sestamibi  Resting Radionuclide Dose: 10.2 mCi   Stress Radionuclide:  Technetium 60m Sestamibi  Stress Radionuclide Dose: 29.2 mCi           Stress Protocol Rest HR: 63 Stress HR: 164  Rest BP:126/86 Stress BP: 167/82  Exercise Time (min): 11:12 METS: 13.40   Predicted Max HR: 171 bpm % Max HR: 95.91 bpm Rate Pressure Product: 27388  Dose of Adenosine (mg):  n/a Dose of Lexiscan: n/a mg  Dose of Atropine (mg): n/a Dose of Dobutamine: n/a mcg/kg/min (at max HR)  Stress Test Technologist: Mellody Memos, CCT Nuclear Technologist: Otho Perl, CNMT   Rest Procedure:  Myocardial perfusion imaging was performed at rest 45 minutes following the intravenous administration of Technetium 29m Sestamibi. Stress Procedure:  The patient performed treadmill exercise using a Bruce  Protocol for 11 minutes 12 seconds. The patient stopped due to fatigue and shortness of breath. Patient  denied any chest pain.  There were no significant ST-T wave changes.  Technetium 5m Sestamibi was injected IV at peak exercise and myocardial perfusion imaging was performed after a brief delay.  Transient Ischemic Dilatation (Normal <1.22):  0.96 QGS EDV:  106 ml QGS ESV:  55 ml LV Ejection Fraction: 48%  Rest ECG: NSR - Normal EKG  Stress ECG: No significant change from baseline ECG  QPS Raw Data Images:  Mild diaphragmatic attenuation.  Normal left ventricular size. Stress Images:  inferoseptal reversibility Rest Images:  mild inferoseptal defect Subtraction (SDS):  No evidence of ischemia.  Impression Exercise Capacity:  Excellent exercise capacity. BP Response:  Normal blood pressure response. Clinical Symptoms:  No significant symptoms noted. ECG Impression:  No significant ST segment change suggestive of ischemia. Comparison with Prior Nuclear Study: No significant change from previous study  Overall Impression:  Low risk stress nuclear study with mild inferoseptal defect that is partially reversible but not significant for ischemia.  LV Wall Motion:  Inferoseptal hypokinesis - EF 48%.  Compared to the prior study in 2014, EF has dropped from 60 -> 48%, there are subtle, but not significant ischemic changes. He has known CAD in the LAD and RCA in 2012 by cath. Consideration for LHC should be given.  Pixie Casino, MD, [facility] Board Certified in Nuclear Cardiology Attending Cardiologist [place], MD  09/08/2013 6:19 PM

## 2013-09-12 ENCOUNTER — Other Ambulatory Visit: Payer: Self-pay | Admitting: *Deleted

## 2013-09-12 DIAGNOSIS — G729 Myopathy, unspecified: Secondary | ICD-10-CM

## 2013-09-20 ENCOUNTER — Ambulatory Visit (HOSPITAL_COMMUNITY)
Admission: RE | Admit: 2013-09-20 | Discharge: 2013-09-20 | Disposition: A | Discharge status: Home / Self Care | Payer: 59 | Source: Ambulatory Visit | Attending: Cardiovascular Disease | Admitting: Cardiovascular Disease

## 2013-09-20 DIAGNOSIS — G729 Myopathy, unspecified: Secondary | ICD-10-CM

## 2013-09-20 DIAGNOSIS — I428 Other cardiomyopathies: Secondary | ICD-10-CM | POA: Insufficient documentation

## 2013-09-20 NOTE — Progress Notes (Signed)
2D Echo Performed 09/20/2013    Marygrace Drought, RCS

## 2013-09-21 ENCOUNTER — Telehealth: Payer: Self-pay | Admitting: Cardiology

## 2013-09-21 NOTE — Telephone Encounter (Signed)
Follow Up    Pt is following up on test results. Requesting a call after 3:30 when he is off of work.

## 2013-09-21 NOTE — Telephone Encounter (Signed)
Patient notified of echo results. Informed he would need appmt with Dr. Stanford Breed. Patient asked to be called on his cell number to set up appmt. Will be gone July 4-12.   Message forwarded to scheduler.

## 2013-09-21 NOTE — Telephone Encounter (Signed)
Echo result notes per Mickel Baas, NP  "Notes Recorded by Cecilie Kicks, NP on 09/21/2013 at 1:52 PM Pt is to see Dr. Stanford Breed ASAP but I do not see appt. Dr. Stanford Breed requested this on the 15th of June. Please check that, Please let pt know his echo was stable."

## 2013-09-28 NOTE — Telephone Encounter (Signed)
Encounter complete. 

## 2013-10-13 ENCOUNTER — Encounter: Payer: Self-pay | Admitting: Cardiology

## 2013-10-13 ENCOUNTER — Ambulatory Visit (INDEPENDENT_AMBULATORY_CARE_PROVIDER_SITE_OTHER): Payer: 59 | Admitting: Cardiology

## 2013-10-13 VITALS — BP 117/72 | HR 73 | Ht 71.0 in | Wt 234.1 lb

## 2013-10-13 DIAGNOSIS — E785 Hyperlipidemia, unspecified: Secondary | ICD-10-CM

## 2013-10-13 DIAGNOSIS — I2510 Atherosclerotic heart disease of native coronary artery without angina pectoris: Secondary | ICD-10-CM

## 2013-10-13 NOTE — Patient Instructions (Signed)
Your physician wants you to follow-up in: ONE YEAR WITH DR CRENSHAW You will receive a reminder letter in the mail two months in advance. If you don't receive a letter, please call our office to schedule the follow-up appointment.  

## 2013-10-13 NOTE — Progress Notes (Signed)
HPI: FU CAD; abnormal cardiac CTA 7/12 suggested significant coronary disease. Cardiac catheterization was performed in July of 2012 and revealed normal LM; normal Lcx; 20% mid LAD followed by a 40% lesion; 20 % distal LAD; 40 % proximal RCA; normal LV function. Seen recently for chest pain. Echocardiogram June 2015 showed normal LV function. Nuclear study June 2015 showed mild inferior septal defect that was partially reversible but not felt significant for ischemia. Since he was last seen he denies dyspnea, chest pain, palpitations or syncope.   Current Outpatient Prescriptions  Medication Sig Dispense Refill  . albuterol (VENTOLIN HFA) 108 (90 BASE) MCG/ACT inhaler Inhale 2 puffs into the lungs every 4 (four) hours as needed.      . Alum & Mag Hydroxide-Simeth (MAGIC MOUTHWASH) SOLN Take 5 mLs by mouth as needed for mouth pain.      Marland Kitchen aspirin 81 MG tablet Take 81 mg by mouth daily.        . cetirizine (ZYRTEC) 10 MG tablet Take 1 tablet (10 mg total) by mouth daily.  30 tablet  6  . fluticasone (FLONASE) 50 MCG/ACT nasal spray 1 spray in each nostril twice a day as needed. Use the "crossover" technique as discussed       . lansoprazole (PREVACID) 30 MG capsule Take 1 capsule (30 mg total) by mouth daily.  30 capsule  6  . levothyroxine (SYNTHROID, LEVOTHROID) 100 MCG tablet Take 1 tablet (100 mcg total) by mouth daily.  30 tablet  2  . montelukast (SINGULAIR) 10 MG tablet Take 10 mg by mouth at bedtime.      . nitroGLYCERIN (NITROSTAT) 0.4 MG SL tablet Place 1 tablet (0.4 mg total) under the tongue every 5 (five) minutes as needed for chest pain (or arm pain).  30 tablet  12  . simvastatin (ZOCOR) 40 MG tablet Take 40 mg by mouth every evening.       No current facility-administered medications for this visit.     Past Medical History  Diagnosis Date  . 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 Last visit w/ urology June 2011 ([place]); last CT of  the  abdomen -chest in [place]:  10/11  . GERD (gastroesophageal reflux disease)   . Hyperlipidemia   . Hypothyroidism   . CAD (coronary artery disease)     non obstructive per cath 09-2010  . Anginal pain     Past Surgical History  Procedure Laterality Date  . Orchiectomy      R  . Umbilical hernia repair    . Tonsillectomy    . Cardiac catheterization      History   Social History  . Marital Status: Married    Spouse Name: N/A    Number of Children: 3  . Years of Education: N/A   Occupational History  . supervisor    Social History Main Topics  . Smoking status: Former Smoker    Quit date: 03/31/2012  . Smokeless tobacco: Never Used  . Alcohol Use: Yes     Comment: 2 per week  . Drug Use: No  . Sexual Activity: Not on file   Other Topics Concern  . Not on file   Social History Narrative  . No narrative on file    ROS: no fevers or chills, productive cough, hemoptysis, dysphasia, odynophagia, melena, hematochezia, dysuria, hematuria, rash, seizure activity, orthopnea, PND, pedal edema, claudication. Remaining systems are negative.  Physical Exam: Well-developed  well-nourished in no acute distress.  Skin is warm and dry.  HEENT is normal.  Neck is supple.  Chest is clear to auscultation with normal expansion.  Cardiovascular exam is regular rate and rhythm.  Abdominal exam nontender or distended. No masses palpated. Extremities show no edema. neuro grossly intact

## 2013-10-13 NOTE — Assessment & Plan Note (Signed)
Continue aspirin and statin. Patient has had no further chest pain and previous symptoms somewhat atypical. Nuclear study has been reviewed and his low risk. Echocardiogram shows normal LV function. Plan medical therapy at this point. We can consider catheterization in the future if symptoms worsen.

## 2013-10-13 NOTE — Assessment & Plan Note (Signed)
Continue statin. Lipids and liver monitored by primary care. 

## 2014-04-14 ENCOUNTER — Emergency Department (HOSPITAL_COMMUNITY)
Admission: EM | Admit: 2014-04-14 | Discharge: 2014-04-14 | Disposition: A | Discharge status: Home / Self Care | Payer: 59 | Attending: Emergency Medicine | Admitting: Emergency Medicine

## 2014-04-14 ENCOUNTER — Emergency Department (HOSPITAL_COMMUNITY): Payer: 59

## 2014-04-14 ENCOUNTER — Encounter (HOSPITAL_COMMUNITY): Payer: Self-pay | Admitting: Emergency Medicine

## 2014-04-14 DIAGNOSIS — E785 Hyperlipidemia, unspecified: Secondary | ICD-10-CM | POA: Insufficient documentation

## 2014-04-14 DIAGNOSIS — M791 Myalgia: Secondary | ICD-10-CM | POA: Insufficient documentation

## 2014-04-14 DIAGNOSIS — Z9889 Other specified postprocedural states: Secondary | ICD-10-CM | POA: Diagnosis not present

## 2014-04-14 DIAGNOSIS — Z8547 Personal history of malignant neoplasm of testis: Secondary | ICD-10-CM | POA: Diagnosis not present

## 2014-04-14 DIAGNOSIS — Z7982 Long term (current) use of aspirin: Secondary | ICD-10-CM | POA: Insufficient documentation

## 2014-04-14 DIAGNOSIS — Z87891 Personal history of nicotine dependence: Secondary | ICD-10-CM | POA: Insufficient documentation

## 2014-04-14 DIAGNOSIS — Z79899 Other long term (current) drug therapy: Secondary | ICD-10-CM | POA: Diagnosis not present

## 2014-04-14 DIAGNOSIS — R1011 Right upper quadrant pain: Secondary | ICD-10-CM | POA: Diagnosis present

## 2014-04-14 DIAGNOSIS — K219 Gastro-esophageal reflux disease without esophagitis: Secondary | ICD-10-CM | POA: Diagnosis not present

## 2014-04-14 DIAGNOSIS — I25119 Atherosclerotic heart disease of native coronary artery with unspecified angina pectoris: Secondary | ICD-10-CM | POA: Diagnosis not present

## 2014-04-14 DIAGNOSIS — E039 Hypothyroidism, unspecified: Secondary | ICD-10-CM | POA: Insufficient documentation

## 2014-04-14 DIAGNOSIS — Z7951 Long term (current) use of inhaled steroids: Secondary | ICD-10-CM | POA: Insufficient documentation

## 2014-04-14 LAB — COMPREHENSIVE METABOLIC PANEL
ALT: 51 U/L (ref 0–53)
AST: 32 U/L (ref 0–37)
Albumin: 4.5 g/dL (ref 3.5–5.2)
Alkaline Phosphatase: 59 U/L (ref 39–117)
Anion gap: 7 (ref 5–15)
BILIRUBIN TOTAL: 0.6 mg/dL (ref 0.3–1.2)
BUN: 19 mg/dL (ref 6–23)
CO2: 25 mmol/L (ref 19–32)
Calcium: 9.2 mg/dL (ref 8.4–10.5)
Chloride: 104 mEq/L (ref 96–112)
Creatinine, Ser: 0.9 mg/dL (ref 0.50–1.35)
GFR calc Af Amer: >90 mL/min (ref >90)
GFR calc non Af Amer: >90 mL/min (ref >90)
Glucose, Bld: 113 mg/dL — ABNORMAL HIGH (ref 70–99)
Potassium: 3.8 mmol/L (ref 3.5–5.1)
Sodium: 136 mmol/L (ref 135–145)
TOTAL PROTEIN: 7.1 g/dL (ref 6.0–8.3)

## 2014-04-14 LAB — CBC WITH DIFFERENTIAL/PLATELET
BASOS ABS: 0 10*3/uL (ref 0.0–0.1)
Basophils Relative: 0 % (ref 0–1)
EOS ABS: 0.4 10*3/uL (ref 0.0–0.7)
Eosinophils Relative: 6 % — ABNORMAL HIGH (ref 0–5)
HCT: 41.8 % (ref 39.0–52.0)
Hemoglobin: 14.7 g/dL (ref 13.0–17.0)
Lymphocytes Relative: 26 % (ref 12–46)
Lymphs Abs: 1.9 10*3/uL (ref 0.7–4.0)
MCH: 29.5 pg (ref 26.0–34.0)
MCHC: 35.2 g/dL (ref 30.0–36.0)
MCV: 83.9 fL (ref 78.0–100.0)
MONO ABS: 0.6 10*3/uL (ref 0.1–1.0)
MONOS PCT: 8 % (ref 3–12)
Neutro Abs: 4.3 10*3/uL (ref 1.7–7.7)
Neutrophils Relative %: 60 % (ref 43–77)
Platelets: 186 10*3/uL (ref 150–400)
RBC: 4.98 MIL/uL (ref 4.22–5.81)
RDW: 13.1 % (ref 11.5–15.5)
WBC: 7.2 10*3/uL (ref 4.0–10.5)

## 2014-04-14 LAB — LIPASE, BLOOD: Lipase: 32 U/L (ref 11–59)

## 2014-04-14 NOTE — ED Notes (Signed)
Pt reports increase and thyroid and medications recently adjusted. Pt reports chronic soreness to arms, legs, and back; pt recent MRI two days ago. Pt continues to report right groin pain; recent US result unremarkable. Pt states abdominal queasiness and loose stools for a month.

## 2014-04-14 NOTE — Discharge Instructions (Signed)

## 2014-04-14 NOTE — ED Provider Notes (Signed)
CSN: [id number]     Arrival date & time 04/14/14  68 History   First MD Initiated Contact with Patient 04/14/14 1836     Chief Complaint  Patient presents with  . Abdominal Pain     (Consider location/radiation/quality/duration/timing/severity/associated sxs/prior Treatment) Patient is a 49 y.o. male presenting with abdominal pain. The history is provided by the patient.  Abdominal Pain Pain location:  RUQ Pain quality: aching   Pain radiates to:  Does not radiate Pain severity:  Mild Onset quality:  Gradual Duration:  2 days Timing:  Intermittent Progression:  Unchanged Chronicity:  New Context comment:  Spontaneously Relieved by:  Nothing Worsened by:  Nothing tried Ineffective treatments:  None tried Associated symptoms: no chest pain, no cough, no diarrhea, no dysuria, no fever, no hematuria, no nausea, no shortness of breath and no vomiting     Past Medical History  Diagnosis Date  . 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 Last visit w/ urology June 2011 ([place]); last CT of  the abdomen -chest in [place]:  10/11  . GERD (gastroesophageal reflux disease)   . Hyperlipidemia   . Hypothyroidism   . CAD (coronary artery disease)     non obstructive per cath 09-2010  . Anginal pain    Past Surgical History  Procedure Laterality Date  . Orchiectomy      R  . Umbilical hernia repair    . Tonsillectomy    . Cardiac catheterization     Family History  Problem Relation Age of Onset  . Arthritis Father   . Diabetes Mother   . Ovarian cancer Mother   . Pancreatic cancer Father   . Cancer Maternal Grandfather     metastatic    History  Substance Use Topics  . Smoking status: Former Smoker    Quit date: 03/31/2012  . Smokeless tobacco: Never Used  . Alcohol Use: Yes     Comment: 2 per week    Review of Systems  Constitutional: Negative for fever.  HENT: Negative for drooling and rhinorrhea.   Eyes: Negative for pain.  Respiratory:  Negative for cough and shortness of breath.   Cardiovascular: Negative for chest pain and leg swelling.  Gastrointestinal: Positive for abdominal pain. Negative for nausea, vomiting and diarrhea.  Genitourinary: Negative for dysuria and hematuria.  Musculoskeletal: Positive for myalgias. Negative for gait problem and neck pain.  Skin: Negative for color change.  Neurological: Negative for numbness and headaches.  Hematological: Negative for adenopathy.  Psychiatric/Behavioral: Negative for behavioral problems.  All other systems reviewed and are negative.     Allergies  Atorvastatin; Doxycycline; and Rosuvastatin  Home Medications   Prior to Admission medications   Medication Sig Start Date End Date Taking? Authorizing Provider  Alum & Mag Hydroxide-Simeth (MAGIC MOUTHWASH) SOLN Take 5 mLs by mouth as needed for mouth pain.   Yes Historical Provider, MD  albuterol (VENTOLIN HFA) 108 (90 BASE) MCG/ACT inhaler Inhale 2 puffs into the lungs every 4 (four) hours as needed.    Historical Provider, MD  aspirin 81 MG tablet Take 81 mg by mouth daily.      Historical Provider, MD  cetirizine (ZYRTEC) 10 MG tablet Take 1 tablet (10 mg total) by mouth daily. 07/23/10   Colon Branch, MD  fluticasone [facility]) 50 MCG/ACT nasal spray 1 spray in each nostril twice a day as needed. Use the "crossover" technique as discussed 11/22/10   Hendricks Limes, MD  lansoprazole (PREVACID) 30 MG capsule Take 1 capsule (30 mg total) by mouth daily. 02/17/11   Midge Minium, MD  levothyroxine (SYNTHROID, LEVOTHROID) 100 MCG tablet Take 1 tablet (100 mcg total) by mouth daily. 02/13/12   Midge Minium, MD  montelukast (SINGULAIR) 10 MG tablet Take 10 mg by mouth at bedtime.    Historical Provider, MD  nitroGLYCERIN (NITROSTAT) 0.4 MG SL tablet Place 1 tablet (0.4 mg total) under the tongue every 5 (five) minutes as needed for chest pain (or arm pain). 09/21/12   Ripudeep Krystal Eaton, MD  simvastatin (ZOCOR) 40 MG  tablet Take 40 mg by mouth every evening. 08/18/13 08/18/14  Historical Provider, MD   BP 132/88 mmHg  Pulse 79  Temp(Src) 98 F (36.7 C) (Oral)  Resp 18  SpO2 98% Physical Exam  Constitutional: He is oriented to person, place, and time. He appears well-developed and well-nourished.  HENT:  Head: Normocephalic and atraumatic.  Right Ear: External ear normal.  Left Ear: External ear normal.  Nose: Nose normal.  Mouth/Throat: Oropharynx is clear and moist. No oropharyngeal exudate.  Eyes: Conjunctivae and EOM are normal. Pupils are equal, round, and reactive to light.  Neck: Normal range of motion. Neck supple.  Cardiovascular: Normal rate, regular rhythm, normal heart sounds and intact distal pulses.  Exam reveals no gallop and no friction rub.   No murmur heard. Pulmonary/Chest: Effort normal and breath sounds normal. No respiratory distress. He has no wheezes.  Abdominal: Soft. Bowel sounds are normal. He exhibits no distension. There is no tenderness. There is no rebound and no guarding.  Musculoskeletal: Normal range of motion. He exhibits no edema or tenderness.  Neurological: He is alert and oriented to person, place, and time.  Skin: Skin is warm and dry.  Psychiatric: He has a normal mood and affect. His behavior is normal.  Nursing note and vitals reviewed.   ED Course  Procedures (including critical care time) Labs Review Labs Reviewed  CBC WITH DIFFERENTIAL - Abnormal; Notable for the following:    Eosinophils Relative 6 (*)    All other components within normal limits  COMPREHENSIVE METABOLIC PANEL - Abnormal; Notable for the following:    Glucose, Bld 113 (*)    All other components within normal limits  LIPASE, BLOOD    Imaging Review US Abdomen Limited Ruq  04/14/2014   CLINICAL DATA:  Right upper quadrant pain.  EXAM: US ABDOMEN LIMITED - RIGHT UPPER QUADRANT  COMPARISON:  None.  FINDINGS: Gallbladder:  No gallstones or wall thickening visualized. No  sonographic Murphy sign noted.  Common bile duct:  Diameter: Normal caliber, 3 mm.  Liver:  Increased echotexture throughout the liver.  No focal abnormality.  IMPRESSION: Diffusely increased echotexture throughout the liver compatible with fatty infiltration or intrinsic liver disease.  No evidence of cholelithiasis or acute cholecystitis.   Electronically Signed   By: Rolm Baptise M.D.   On: 04/14/2014 20:21     EKG Interpretation None      MDM   Final diagnoses:  RUQ pain    7:18 PM 49 y.o. male w hx of CAD, testicular cancer status post orchiectomy who presents with right upper quadrant pain intermittently over the last 2 days. He states that he has had aching pains in his lower extremities over the last 4-6 weeks. He has had a significant workup by his PCP with labs and MRI of his low back. I reviewed the imaging and lab work which is noncontributory. He  is afebrile and vital signs are unremarkable here. We'll get screening labs and right upper quadrant ultrasound.  10:00 PM: I interpreted/reviewed the labs and/or imaging which were non-contributory.  Pt continues to appear well. Does not want pain medicine. I have discussed the diagnosis/risks/treatment options with the patient and believe the pt to be eligible for discharge home to follow-up with his pcp. We also discussed returning to the ED immediately if new or worsening sx occur. We discussed the sx which are most concerning (e.g., worsening abd pain, fever, vomiting) that necessitate immediate return. Medications administered to the patient during their visit and any new prescriptions provided to the patient are listed below.  Medications given during this visit Medications - No data to display  New Prescriptions   No medications on file     Pamella Pert, MD 04/15/14 1514

## 2014-04-14 NOTE — ED Notes (Signed)
Pt c/o abdominal pain x 2 days. Pain is intermittent, dull. Pt denies vomiting. States pain 3/10. Pt also states his lower legs are painful and cramping. Pt ambulatory at triage. Pt is concerned about abnormal CK and thyroid labs recently obtained from MD.

## 2014-09-10 ENCOUNTER — Encounter (HOSPITAL_COMMUNITY): Payer: Self-pay | Admitting: *Deleted

## 2014-09-10 DIAGNOSIS — Z87891 Personal history of nicotine dependence: Secondary | ICD-10-CM | POA: Insufficient documentation

## 2014-09-10 DIAGNOSIS — K219 Gastro-esophageal reflux disease without esophagitis: Secondary | ICD-10-CM | POA: Insufficient documentation

## 2014-09-10 DIAGNOSIS — I25119 Atherosclerotic heart disease of native coronary artery with unspecified angina pectoris: Secondary | ICD-10-CM | POA: Insufficient documentation

## 2014-09-10 DIAGNOSIS — R1010 Upper abdominal pain, unspecified: Secondary | ICD-10-CM | POA: Insufficient documentation

## 2014-09-10 DIAGNOSIS — R109 Unspecified abdominal pain: Secondary | ICD-10-CM | POA: Diagnosis present

## 2014-09-10 DIAGNOSIS — Z9889 Other specified postprocedural states: Secondary | ICD-10-CM | POA: Insufficient documentation

## 2014-09-10 DIAGNOSIS — Z79899 Other long term (current) drug therapy: Secondary | ICD-10-CM | POA: Diagnosis not present

## 2014-09-10 DIAGNOSIS — E039 Hypothyroidism, unspecified: Secondary | ICD-10-CM | POA: Insufficient documentation

## 2014-09-10 DIAGNOSIS — E785 Hyperlipidemia, unspecified: Secondary | ICD-10-CM | POA: Insufficient documentation

## 2014-09-10 DIAGNOSIS — Z7982 Long term (current) use of aspirin: Secondary | ICD-10-CM | POA: Insufficient documentation

## 2014-09-10 DIAGNOSIS — Z8547 Personal history of malignant neoplasm of testis: Secondary | ICD-10-CM | POA: Diagnosis not present

## 2014-09-10 NOTE — ED Notes (Signed)
The pt has had abd pain for approx  One week.  He has had some diarrhea.  Sl nausea still

## 2014-09-11 ENCOUNTER — Emergency Department (HOSPITAL_COMMUNITY)
Admission: EM | Admit: 2014-09-11 | Discharge: 2014-09-11 | Disposition: A | Discharge status: Home / Self Care | Payer: 59 | Attending: Emergency Medicine | Admitting: Emergency Medicine

## 2014-09-11 ENCOUNTER — Emergency Department (HOSPITAL_COMMUNITY): Payer: 59

## 2014-09-11 DIAGNOSIS — R109 Unspecified abdominal pain: Secondary | ICD-10-CM

## 2014-09-11 LAB — COMPREHENSIVE METABOLIC PANEL
ALT: 51 U/L (ref 17–63)
ANION GAP: 9 (ref 5–15)
AST: 34 U/L (ref 15–41)
Albumin: 4.3 g/dL (ref 3.5–5.0)
Alkaline Phosphatase: 57 U/L (ref 38–126)
BILIRUBIN TOTAL: 0.7 mg/dL (ref 0.3–1.2)
BUN: 13 mg/dL (ref 6–20)
CO2: 25 mmol/L (ref 22–32)
Calcium: 9.6 mg/dL (ref 8.9–10.3)
Chloride: 103 mmol/L (ref 101–111)
Creatinine, Ser: 1.09 mg/dL (ref 0.61–1.24)
GFR calc Af Amer: >60 mL/min (ref >60)
GFR calc non Af Amer: >60 mL/min (ref >60)
Glucose, Bld: 114 mg/dL — ABNORMAL HIGH (ref 65–99)
Potassium: 4 mmol/L (ref 3.5–5.1)
Sodium: 137 mmol/L (ref 135–145)
TOTAL PROTEIN: 6.7 g/dL (ref 6.5–8.1)

## 2014-09-11 LAB — CBC WITH DIFFERENTIAL/PLATELET
BASOS ABS: 0.1 10*3/uL (ref 0.0–0.1)
Basophils Relative: 1 % (ref 0–1)
Eosinophils Absolute: 0.4 10*3/uL (ref 0.0–0.7)
Eosinophils Relative: 5 % (ref 0–5)
HEMATOCRIT: 43.5 % (ref 39.0–52.0)
Hemoglobin: 15.1 g/dL (ref 13.0–17.0)
LYMPHS ABS: 2.5 10*3/uL (ref 0.7–4.0)
LYMPHS PCT: 29 % (ref 12–46)
MCH: 29.2 pg (ref 26.0–34.0)
MCHC: 34.7 g/dL (ref 30.0–36.0)
MCV: 84.1 fL (ref 78.0–100.0)
Monocytes Absolute: 0.6 10*3/uL (ref 0.1–1.0)
Monocytes Relative: 7 % (ref 3–12)
Neutro Abs: 5.1 10*3/uL (ref 1.7–7.7)
Neutrophils Relative %: 58 % (ref 43–77)
Platelets: 228 10*3/uL (ref 150–400)
RBC: 5.17 MIL/uL (ref 4.22–5.81)
RDW: 12.6 % (ref 11.5–15.5)
WBC: 8.6 10*3/uL (ref 4.0–10.5)

## 2014-09-11 LAB — LIPASE, BLOOD: LIPASE: 93 U/L — AB (ref 22–51)

## 2014-09-11 MED ORDER — FAMOTIDINE 20 MG PO TABS
40.0000 mg | ORAL_TABLET | Freq: Once | ORAL | Status: AC
  Administered 2014-09-11: 40 mg via ORAL
  Filled 2014-09-11: qty 2

## 2014-09-11 MED ORDER — DICYCLOMINE HCL 10 MG PO CAPS
20.0000 mg | ORAL_CAPSULE | Freq: Once | ORAL | Status: AC
  Administered 2014-09-11: 20 mg via ORAL
  Filled 2014-09-11: qty 2

## 2014-09-11 NOTE — Discharge Instructions (Signed)
°Emergency Department Resource Guide °1) Find a Doctor and Pay Out of Pocket °Although you won't have to find out who is covered by your insurance plan, it is a good idea to ask around and get recommendations. You will then need to call the office and see if the doctor you have chosen will accept you as a new patient and what types of options they offer for patients who are self-pay. Some doctors offer discounts or will set up payment plans for their patients who do not have insurance, but you will need to ask so you aren't surprised when you get to your appointment. ° °2) Contact Your Local Health Department °Not all health departments have doctors that can see patients for sick visits, but many do, so it is worth a call to see if yours does. If you don't know where your local health department is, you can check in your phone book. The CDC also has a tool to help you locate your state's health department, and many state websites also have listings of all of their local health departments. ° °3) Find a Walk-in Clinic °If your illness is not likely to be very severe or complicated, you may want to try a walk in clinic. These are popping up all over the country in pharmacies, drugstores, and shopping centers. They're usually staffed by nurse practitioners or physician assistants that have been trained to treat common illnesses and complaints. They're usually fairly quick and inexpensive. However, if you have serious medical issues or chronic medical problems, these are probably not your best option. ° °No Primary Care Doctor: °- Call [facility] at  832-8000 - they can help you locate a primary care doctor that  accepts your insurance, provides certain services, etc. °- Physician Referral Service- 1-800-533-3463 ° °Chronic Pain Problems: °Organization         Address  Phone   Notes  °[facility]  (336) [phone] Patients need to be referred by their primary care doctor.  ° °Medication  Assistance: °Organization         Address  Phone   Notes  °[place] [facility] [address] °[place], [place] [address] (336) 641-8030 --Must be a resident of [place] °-- Must have NO insurance coverage whatsoever (no Medicaid/ Medicare, etc.) °-- The pt. MUST have a primary care doctor that directs their care regularly and follows them in the community °  °[facility]  (866) 331-1348   °[employer]  (888) 892-1162   ° °Agencies that provide inexpensive medical care: °Organization         Address  Phone   Notes  °[facility]  (336) 832-8035   °[facility]    (336) 832-7272   °[facility] [address] °[place], [place] [address] (336) 832-4777   °[facility] [address], °[place] (336) 271-4999   °[facility]    (336) 373-0678   °[facility]    (336) 272-1050   °[facility] ° [address], [place] Phone:  (336) 832-4444, Fax:  (336) 832-4440 Hours of Operation:  9 am - 6 pm, M-F.  Also accepts Medicaid/Medicare and self-pay.  °[facility] ° [address],  Phone: (336) 832-3150, Fax: (336) [phone]. Hours of Operation:  8:30 am - 5:30 pm, M-F.  Also accepts Medicaid and self-pay.  °[facility] [place] [address], [place] Phone: (336) 878-6027   °[facility] [address], [place], [place] (336)723-1848, Ext. 123 Mondays & Thursdays: 7-9 AM.  First 15 patients are seen on a first come, first serve basis. °  ° °Medicaid-accepting [place] Providers: ° °Organization         Address  Phone   Notes  °[facility] [address], [place] (336) 641-2100 Also accepts self-pay patients.  °[facility] [address], [place] ° (336) 856-9996   °[facility] [address], [place]  (336) 288-8857   °[facility] [address] [place] [address], [place] (336) 299-7000   °Veita Bland [address], [place]  ° (336) 373-1557 Only accepts [place] Access Medicaid patients after they have their name applied to their card.  ° °Self-Pay (no insurance) in [place]: ° °Organization         Address  Phone   Notes  °Sickle Cell Patients, [facility] [address], [place] (336) 832-1970   °[facility] [address], [place] (336) 832-4400   °[facility] ° [address], [place] (336) 992-4800   °[facility]/Dr. Osei-Bonsu ° [address] [place] [address], [place] or [address], [place] (336) 841-8500 Phone number for both [place] and [place] locations is the same.  °[facility] [address], [place] (336) 299-0000   °[facility] [address] [place] [address], [place] or [address] (336) 852-7530 °(336) 878-2260   °[facility] [address], [place] (336) 350-1642, phone; (336) 294-5005, fax Sees patients 1st and 3rd Saturday of every month.  Must not qualify for public or private insurance (i.e. Medicaid, Medicare, [place] Health Choice, Veterans' Benefits) • Household income should be no more than 200% of the poverty level •The clinic cannot treat you if you are pregnant or think you are pregnant • Sexually transmitted diseases are not treated at the clinic.  ° ° °Dental Care: °Organization         Address  Phone  Notes  °[place] [facility] [address], [place] (336) 641-6152 Accepts children up to age 21 who are enrolled in Medicaid or [place] Health Choice; pregnant women with a Medicaid card; and children who have applied for Medicaid or [place] Health Choice, but were declined, whose parents can pay a reduced fee at time of service.  °[place]  [facility] [place]  [address], [place] (336) 641-7733 Accepts children up to age 21 who are enrolled in Medicaid or  Health Choice; pregnant women with a Medicaid card; and children who have applied for Medicaid or  Health Choice, but were declined, whose parents can pay a reduced fee at time of service.  °[facility] ° [address],  (336) 641-4533 Patients are seen by appointment only. Walk-ins are not accepted. [facility] will see patients 18 years of age and older. °Monday - Tuesday (8am-5pm) °Most Wednesdays (8:30-5pm) °$30 per visit, cash only  °[facility] ° [address], [place] (336) 641-4533 Patients are seen by appointment only. Walk-ins are not accepted. [facility] will see patients 18 years of age and older. °One   Wednesday Evening (Monthly: Volunteer Based).  $30 per visit, cash only  °[facility]  (919) [phone] for adults; Children under age 4, call Graduate Pediatric Dentistry at ([phone]) [phone]. Children aged 4-14, please call ([phone]) [phone] to request a pediatric application. ° Dental services are provided in all areas of dental care including fillings, crowns and bridges, complete and partial dentures, implants, gum treatment, root canals, and extractions. Preventive care is also provided. Treatment is provided to both adults and children. °Patients are selected via a lottery and there is often a waiting list. °  °[facility] [address], °[place] ° (336) 763-8833 [email or web address] °  °[facility] [address], [place], [place] (336)723-1848, Ext. 123 Second and Fourth Thursday of each month, opens at 6:30 AM; Clinic ends at 9 AM.  Patients are seen on a first-come first-served basis, and a limited number are seen during each clinic.  ° °[facility] ° [address], [place], [place] (336) 723-7904    Eligibility Requirements °You must have lived in [place], [place], or [place] counties for at least the last three months. °  You cannot be eligible for state or federal sponsored healthcare insurance, including Veterans Administration, Medicaid, or Medicare. °  You generally cannot be eligible for healthcare insurance through your employer.  °  How to apply: °Eligibility screenings are held every Tuesday and Wednesday afternoon from 1:00 pm until 4:00 pm. You do not need an appointment for the interview!  °[facility] [address], [place], [place] 336-631-2330   °[facility]  336-342-8273   °[facility]  336-703-3100   °[facility]  336-570-6415   ° °Behavioral Health Resources in the Community: °Intensive Outpatient Programs °Organization         Address  Phone  Notes  °[facility] [address], [place], [place] 336-878-6098   °[facility] [address], [place], [place] 336-832-9800   °[facility] [address], [place], [place] ° 336-882-2125   °[facility] [address],  °[place], [place] 1-800-853-5163 or 336-641-4981   °Substance Abuse Resources °Organization         Address  Phone  Notes  °[facility]  336-882-2125   °[facility]  336-784-9470   °[facility]  336-285-9073   °[facility]  336-845-3988   °Residential & Outpatient Substance Abuse Program  1-800-659-3381   °Psychological Services °Organization         Address  Phone  Notes  °[facility]  336- 832-9600   °[facility]  336- 378-7881   °[facility] [address], [place] 1-800-853-5163 or 336-641-4981   ° °Mobile Crisis Teams °Organization         Address  Phone  Notes  °[facility], Mobile Crisis Care Unit  1-877-626-1772   °Assertive °[facility] ° [address]  [place], [place] 336-834-9664   °Sharon DeEsch [address] °  336-554-5454   ° °Self-Help/Support Groups °Organization         Address  Phone             Notes  °[facility]  - variety of support groups  336- 373-1402 Call for more information  °Narcotics Anonymous (NA), [facility] [address], °[place]   2 meetings at this location  ° °  Residential Treatment Programs Organization         Address  Phone  Notes  [facility] [address]    [place]  1-249 516 3879   [facility]  [address] [place] [address], [place], [place]   [place], [place] 848-144-1296 Admissions: 8am-3pm M-F  [facility] [place] [address]    [place], [place] 527-782-4235   [facility] [address] [place], [place], [place]   [facility] [address],  [place], [place]   [facility] - Intensive Outpatient [address], [place], [place]   [facility] ([place]  [place], [place] 1-(310)319-2318 or (918) 568-1556   [facility] [address] [place], [place] Accepts Medicaid  [facility] [address]  [place] 1-(228) 644-5898 Substance Abuse/Addiction Treatment   [facility] Organization         Address  Phone  Notes  [facility]  [phone]   Domenic Schwab, PhD [address] [place], [place]   (303) 377-1771 or 770-452-1569   [place], [place] 234-418-5556   [facility] [address], [place], [place] [phone] Insurance/Medicaid/sponsorship through [facility] [address] [place]                                    [place], [place] ([phone] [facility] [address], [place] (320)[phone]    Dr. Adele Schilder  [phone]   [facility] Dept. 1) [address], [place] 2) [place] 3)  [address], [place] ([phone]  (613) 131-0028   [place] 902-426-6058 or [phone] (After Hours)      Eat a bland diet, avoiding greasy, fatty, fried foods, as well as spicy and acidic foods or beverages.  Avoid eating within the hour or 2 before going to bed or laying down.  Also avoid teas, colas, coffee, chocolate, pepermint and spearment.  Take over the counter pepcid, one tablet by mouth twice a day, for the next 2 to 3 weeks.  May also take over the counter maalox/mylanta, as directed on packaging, as needed for discomfort.  Take your usual prescriptions as previously directed.  Call your regular medical doctor today to schedule a follow up appointment in the next 2 days. Call your GI doctor today to confirm your previously scheduled appointment on 09/29/14.  Return to the Emergency Department immediately if worsening.

## 2014-09-11 NOTE — ED Provider Notes (Signed)
CSN: [id number]     Arrival date & time 09/10/14  2321 History   First MD Initiated Contact with Patient 09/11/14 0201     Chief Complaint  Patient presents with  . Abdominal Pain     HPI Pt was seen at 0200.  Per pt, c/o gradual onset and persistence of constant upper abd "pain" for the past 2 months.  Has been associated with nausea and "loose stools."  Describes the abd pain as "cramping."  Pt has been evaluated by his PMD for same, told his lipase was mildly elevated, and there were "spots on my pancreas on my CT scan last week." Pt states he has f/u GI MD appt in 2 weeks for EGD/colonoscopy. Pt states he was dx IBS, and rx protonix, zofran, and bentyl for his symptoms with improvement. Pt states he stopped taking those meds and his symptoms came back. Denies vomiting/diarrhea, no fevers, no back pain, no rash, no CP/SOB, no black or blood in stools.       Past Medical History  Diagnosis Date  . 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 Last visit w/ urology June 2011 ([place]); last CT of  the abdomen -chest in [place]:  10/11  . GERD (gastroesophageal reflux disease)   . Hyperlipidemia   . Hypothyroidism   . CAD (coronary artery disease)     non obstructive per cath 09-2010  . Anginal pain    Past Surgical History  Procedure Laterality Date  . Orchiectomy      R  . Umbilical hernia repair    . Tonsillectomy    . Cardiac catheterization     Family History  Problem Relation Age of Onset  . Arthritis Father   . Diabetes Mother   . Ovarian cancer Mother   . Pancreatic cancer Father   . Cancer Maternal Grandfather     metastatic    History  Substance Use Topics  . Smoking status: Former Smoker    Quit date: 03/31/2012  . Smokeless tobacco: Never Used  . Alcohol Use: Yes     Comment: 2 per week    Review of Systems ROS: Statement: All systems negative except as marked or noted in the HPI; Constitutional: Negative for fever and chills. ; ; Eyes:  Negative for eye pain, redness and discharge. ; ; ENMT: Negative for ear pain, hoarseness, nasal congestion, sinus pressure and sore throat. ; ; Cardiovascular: Negative for chest pain, palpitations, diaphoresis, dyspnea and peripheral edema. ; ; Respiratory: Negative for cough, wheezing and stridor. ; ; Gastrointestinal: +abd pain, nausea. Negative for vomiting, diarrhea, blood in stool, hematemesis, jaundice and rectal bleeding. . ; ; Genitourinary: Negative for dysuria, flank pain and hematuria. ; ; Musculoskeletal: Negative for back pain and neck pain. Negative for swelling and trauma.; ; Skin: Negative for pruritus, rash, abrasions, blisters, bruising and skin lesion.; ; Neuro: Negative for headache, lightheadedness and neck stiffness. Negative for weakness, altered level of consciousness , altered mental status, extremity weakness, paresthesias, involuntary movement, seizure and syncope.      Allergies  Atorvastatin; Doxycycline; Gabapentin; Pravastatin; and Rosuvastatin  Home Medications   Prior to Admission medications   Medication Sig Start Date End Date Taking? Authorizing Provider  albuterol (VENTOLIN HFA) 108 (90 BASE) MCG/ACT inhaler Inhale 2 puffs into the lungs every 4 (four) hours as needed for wheezing or shortness of breath.    Yes Historical Provider, MD  Alum & Mag Hydroxide-Simeth (MAGIC MOUTHWASH) SOLN Take  5 mLs by mouth as needed for mouth pain.   Yes Historical Provider, MD  aspirin 81 MG tablet Take 81 mg by mouth daily.     Yes Historical Provider, MD  cetirizine (ZYRTEC) 10 MG tablet Take 1 tablet (10 mg total) by mouth daily. 07/23/10  Yes Colon Branch, MD  dicyclomine (BENTYL) 10 MG capsule Take 10 mg by mouth 3 (three) times daily as needed for spasms.   Yes Historical Provider, MD  fluticasone (FLONASE) 50 MCG/ACT nasal spray 1 spray in each nostril twice a day as needed. Use the "crossover" technique as discussed 11/22/10  Yes Hendricks Limes, MD  levothyroxine  (SYNTHROID, LEVOTHROID) 112 MCG tablet Take 112 mcg by mouth daily before breakfast.   Yes Historical Provider, MD  montelukast (SINGULAIR) 10 MG tablet Take 10 mg by mouth at bedtime.   Yes Historical Provider, MD  nitroGLYCERIN (NITROSTAT) 0.4 MG SL tablet Place 1 tablet (0.4 mg total) under the tongue every 5 (five) minutes as needed for chest pain (or arm pain). 09/21/12  Yes Ripudeep Krystal Eaton, MD  ondansetron (ZOFRAN) 4 MG tablet Take 4 mg by mouth every 8 (eight) hours as needed for nausea or vomiting.   Yes Historical Provider, MD  pantoprazole (PROTONIX) 40 MG tablet Take 40 mg by mouth daily.   Yes Historical Provider, MD  simvastatin (ZOCOR) 40 MG tablet Take 40 mg by mouth daily.   Yes Historical Provider, MD   BP 141/98 mmHg  Pulse 75  Temp(Src) 98.5 F (36.9 C) (Oral)  Resp 22  Ht 5\' 11"  (1.803 m)  Wt 240 lb (108.863 kg)  BMI 33.49 kg/m2  SpO2 97% Physical Exam  0205: Physical examination:  Nursing notes reviewed; Vital signs and O2 SAT reviewed;  Constitutional: Well developed, Well nourished, Well hydrated, In no acute distress; Head:  Normocephalic, atraumatic; Eyes: EOMI, PERRL, No scleral icterus; ENMT: Mouth and pharynx normal, Mucous membranes moist; Neck: Supple, Full range of motion, No lymphadenopathy; Cardiovascular: Regular rate and rhythm, No murmur, rub, or gallop; Respiratory: Breath sounds clear & equal bilaterally, No rales, rhonchi, wheezes.  Speaking full sentences with ease, Normal respiratory effort/excursion; Chest: Nontender, Movement normal; Abdomen: Soft, Nontender, Nondistended, Normal bowel sounds; Genitourinary: No CVA tenderness; Extremities: Pulses normal, No tenderness, No edema, No calf edema or asymmetry.; Neuro: AA&Ox3, Major CN grossly intact.  Speech clear. No gross focal motor or sensory deficits in extremities.; Skin: Color normal, Warm, Dry.; Psych:  Anxious.    ED Course  Procedures    [phone]   Attending MD:  Servando Snare [id number]   Ordering  MD:  Servando Snare Date of Birth:  1965-06-05    Sex: M Admit Date:  09/07/2014 09:31    ###FINAL RESULT###      INDICATIONS: ABNORMAL CT IMAGING FAMILY HX OF PANCREATIC CANCER COMMENTS:       PROCEDURE:  QCT 0007- CT ABD WO W/CONT - Sep 07 2014    [employer] Accession #: [id number] [employer] Accession #: [id number]   INDICATION: ABNORMAL CT IMAGING FAMILY HX OF PANCREATIC CANCER.      COMPARISON:  None.   TECHNIQUE:  CT ABD WO W/CONT - Isovue 370, 100 cc  Radiation dose reduction was utilized (automated exposure control, mA or kV adjustment based on patient size, or iterative image reconstruction).  FINDINGS:   SOLID VISCERA: Liver: Hepatic steatosis. Gallbladder: No radiodense gallstones. Pancreas: There are 2 small low-density areas within the pancreatic head one measuring 1.1 cm and the other  1.4 cm. There is also some area of low-density in the region of the uncinate process, measuring 1.5 cm. These areas do not definitely enhance but the margins are not well defined. There are small periportal lymph nodes, largest measuring 1 cm image 39. There are 2 small nonspecific areas of nodularity measuring fluid density within the mesentery of the upper abdomen, one measures 1 cm and the other measures 1.5 cm on image 59. Adrenal glands: Intact. Spleen: Intact. Kidneys: 10mm exophytic lesion in the midpole the right kidney likely a tiny cyst. No calculi or structural.  GI: No bowel obstruction. No focal bowel wall thickening or inflammatory changes.      PERITONEAL CAVITY/RETROPERITONEUM: No free fluid. No pneumoperitoneum.  VISUALIZED LOWER THORAX: No acute abnormalities.  MUSCULOSKELETAL: Vertebral body hemangiomas at T10-11 and 12 L2 and L3. Indeterminate  MISC./CHRONIC FINDINGS: There are atherosclerotic changes of the aorta.. Incidental finding of a celiac or mesenteric trunk      IMPRESSION: 1.  Hepatic steatosis.  2.  3 low-density lesions in the pancreatic head/uncinate  process are likely cystic but have somewhat ill-defined margins. Indeterminate nodular areas of fluid within the mesentery of the upper abdomen adjacent to the pancreas. Question whether these are residua of previous pancreatitis. Further evaluation with pancreatic MRI or endoscopic ultrasound may be helpful. Otherwise suggest) or wall followup examination.  3.  Small right renal cyst.     ___________________________________________________________ Current Report Read by:  Maurice Small  [id number] on Sep 07 2014  1:43P Transcribed byJari Pigg   on Sep 07 2014  2:01P Electronically Signed by:  DR. Maurice Small on:  Sep 07 2014  2:01P     MDM  MDM Reviewed: previous chart, nursing note and vitals Reviewed previous: labs and CT scan Interpretation: labs and x-ray     Results for orders placed or performed during the hospital encounter of 09/11/14  CBC with Differential  Result Value Ref Range   WBC 8.6 4.0 - 10.5 K/uL   RBC 5.17 4.22 - 5.81 MIL/uL   Hemoglobin 15.1 13.0 - 17.0 g/dL   HCT 43.5 39.0 - 52.0 %   MCV 84.1 78.0 - 100.0 fL   MCH 29.2 26.0 - 34.0 pg   MCHC 34.7 30.0 - 36.0 g/dL   RDW 12.6 11.5 - 15.5 %   Platelets 228 150 - 400 K/uL   Neutrophils Relative % 58 43 - 77 %   Neutro Abs 5.1 1.7 - 7.7 K/uL   Lymphocytes Relative 29 12 - 46 %   Lymphs Abs 2.5 0.7 - 4.0 K/uL   Monocytes Relative 7 3 - 12 %   Monocytes Absolute 0.6 0.1 - 1.0 K/uL   Eosinophils Relative 5 0 - 5 %   Eosinophils Absolute 0.4 0.0 - 0.7 K/uL   Basophils Relative 1 0 - 1 %   Basophils Absolute 0.1 0.0 - 0.1 K/uL  Comprehensive metabolic panel  Result Value Ref Range   Sodium 137 135 - 145 mmol/L   Potassium 4.0 3.5 - 5.1 mmol/L   Chloride 103 101 - 111 mmol/L   CO2 25 22 - 32 mmol/L   Glucose, Bld 114 (H) 65 - 99 mg/dL   BUN 13 6 - 20 mg/dL   Creatinine, Ser 1.09 0.61 - 1.24 mg/dL   Calcium 9.6 8.9 - 10.3 mg/dL   Total Protein 6.7 6.5 - 8.1 g/dL   Albumin 4.3 3.5 - 5.0 g/dL   AST 34  15 - 41  U/L   ALT 51 17 - 63 U/L   Alkaline Phosphatase 57 38 - 126 U/L   Total Bilirubin 0.7 0.3 - 1.2 mg/dL   GFR calc non Af Amer >60 >60 mL/min   GFR calc Af Amer >60 >60 mL/min   Anion gap 9 5 - 15  Lipase, blood  Result Value Ref Range   Lipase 93 (H) 22 - 51 U/L    Dg Abd Acute W/chest 09/11/2014   CLINICAL DATA:  Left upper abdominal pain moving across the abdomen and now on the lower abdomen. Nausea. Symptoms for 2 weeks.  EXAM: DG ABDOMEN ACUTE W/ 1V CHEST  COMPARISON:  CT abdomen and pelvis 06/14/2013.  Chest 09/20/2012.  FINDINGS: Normal heart size and pulmonary vascularity. No focal airspace disease or consolidation in the lungs. No blunting of costophrenic angles. No pneumothorax. Mediastinal contours appear intact.  Scattered gas and stool in the colon. No small or large bowel distention. No free intra-abdominal air. No abnormal air-fluid levels. No radiopaque stones. Degenerative changes in the spine. Calcified phleboliths in the pelvis.  IMPRESSION: No evidence of active pulmonary disease. Normal nonobstructive bowel gas pattern.   Electronically Signed   By: Lucienne Capers M.D.   On: 09/11/2014 03:09    0310:  Lipase mildly elevated today. CT scan 09/07/14 as above, with pt perseverating regarding the pancreatic findings (3 lesions); wanting to know "if they're cancer" and "exactly" what they are. Pt referred to his upcoming GI MD appt to obtain the EGD (and any further testing the GI MD would like to do) to help answer these questions. No clear indication for admission today. Pt given pepcid and bentyl here with improvement of his symptoms. Pt has tol PO well while in the ED without N/V.  No stooling while in the ED.  Abd remains benign, VSS. Pt wants to go home now. Dx and testing d/w pt.  Questions answered.  Verb understanding, agreeable to d/c home with outpt f/u.     Francine Graven, DO 09/14/14 1324

## 2014-09-29 DIAGNOSIS — K862 Cyst of pancreas: Secondary | ICD-10-CM | POA: Insufficient documentation

## 2014-10-09 ENCOUNTER — Other Ambulatory Visit: Payer: Self-pay | Admitting: Otolaryngology

## 2014-10-09 DIAGNOSIS — J029 Acute pharyngitis, unspecified: Secondary | ICD-10-CM

## 2014-10-09 DIAGNOSIS — M542 Cervicalgia: Secondary | ICD-10-CM

## 2014-10-11 ENCOUNTER — Ambulatory Visit
Admission: RE | Admit: 2014-10-11 | Discharge: 2014-10-11 | Disposition: A | Discharge status: Home / Self Care | Payer: 59 | Source: Ambulatory Visit | Attending: Otolaryngology | Admitting: Otolaryngology

## 2014-10-11 DIAGNOSIS — M542 Cervicalgia: Secondary | ICD-10-CM

## 2014-10-11 DIAGNOSIS — J029 Acute pharyngitis, unspecified: Secondary | ICD-10-CM

## 2015-02-21 DIAGNOSIS — Z8547 Personal history of malignant neoplasm of testis: Secondary | ICD-10-CM | POA: Insufficient documentation

## 2015-05-01 ENCOUNTER — Other Ambulatory Visit: Payer: Self-pay | Admitting: Nurse Practitioner

## 2015-05-01 ENCOUNTER — Encounter (HOSPITAL_COMMUNITY)
Admission: EM | Disposition: A | Discharge status: Home / Self Care | Payer: Self-pay | Source: Home / Self Care | Attending: Emergency Medicine

## 2015-05-01 ENCOUNTER — Other Ambulatory Visit: Payer: Self-pay | Admitting: *Deleted

## 2015-05-01 ENCOUNTER — Observation Stay (HOSPITAL_COMMUNITY)
Admission: EM | Admit: 2015-05-01 | Discharge: 2015-05-01 | Disposition: A | Discharge status: Home / Self Care | Payer: 59 | Attending: Cardiology | Admitting: Cardiology

## 2015-05-01 ENCOUNTER — Encounter: Payer: Self-pay | Admitting: Nurse Practitioner

## 2015-05-01 ENCOUNTER — Encounter (HOSPITAL_COMMUNITY): Payer: Self-pay | Admitting: Emergency Medicine

## 2015-05-01 ENCOUNTER — Emergency Department (HOSPITAL_COMMUNITY): Payer: 59

## 2015-05-01 ENCOUNTER — Ambulatory Visit (INDEPENDENT_AMBULATORY_CARE_PROVIDER_SITE_OTHER): Payer: 59 | Admitting: Nurse Practitioner

## 2015-05-01 ENCOUNTER — Ambulatory Visit (HOSPITAL_COMMUNITY): Admission: AD | Admit: 2015-05-01 | Payer: Self-pay | Source: Ambulatory Visit | Admitting: Cardiology

## 2015-05-01 VITALS — BP 148/90 | HR 70 | Ht 71.0 in | Wt 240.1 lb

## 2015-05-01 DIAGNOSIS — E785 Hyperlipidemia, unspecified: Secondary | ICD-10-CM | POA: Diagnosis not present

## 2015-05-01 DIAGNOSIS — I200 Unstable angina: Secondary | ICD-10-CM | POA: Diagnosis not present

## 2015-05-01 DIAGNOSIS — Z79899 Other long term (current) drug therapy: Secondary | ICD-10-CM | POA: Diagnosis not present

## 2015-05-01 DIAGNOSIS — I25110 Atherosclerotic heart disease of native coronary artery with unstable angina pectoris: Secondary | ICD-10-CM | POA: Diagnosis not present

## 2015-05-01 DIAGNOSIS — Z87891 Personal history of nicotine dependence: Secondary | ICD-10-CM | POA: Insufficient documentation

## 2015-05-01 DIAGNOSIS — I209 Angina pectoris, unspecified: Secondary | ICD-10-CM | POA: Diagnosis not present

## 2015-05-01 DIAGNOSIS — Z8547 Personal history of malignant neoplasm of testis: Secondary | ICD-10-CM | POA: Diagnosis not present

## 2015-05-01 DIAGNOSIS — E039 Hypothyroidism, unspecified: Secondary | ICD-10-CM | POA: Diagnosis not present

## 2015-05-01 DIAGNOSIS — I10 Essential (primary) hypertension: Secondary | ICD-10-CM

## 2015-05-01 DIAGNOSIS — Z7982 Long term (current) use of aspirin: Secondary | ICD-10-CM | POA: Insufficient documentation

## 2015-05-01 DIAGNOSIS — K219 Gastro-esophageal reflux disease without esophagitis: Secondary | ICD-10-CM | POA: Diagnosis present

## 2015-05-01 DIAGNOSIS — R079 Chest pain, unspecified: Secondary | ICD-10-CM

## 2015-05-01 DIAGNOSIS — I2510 Atherosclerotic heart disease of native coronary artery without angina pectoris: Secondary | ICD-10-CM

## 2015-05-01 HISTORY — PX: CARDIAC CATHETERIZATION: SHX172

## 2015-05-01 HISTORY — DX: Pure hypercholesterolemia, unspecified: E78.00

## 2015-05-01 LAB — COMPREHENSIVE METABOLIC PANEL
ALT: 49 U/L (ref 17–63)
AST: 30 U/L (ref 15–41)
Albumin: 4 g/dL (ref 3.5–5.0)
Alkaline Phosphatase: 94 U/L (ref 38–126)
Anion gap: 9 (ref 5–15)
BUN: 11 mg/dL (ref 6–20)
CO2: 25 mmol/L (ref 22–32)
Calcium: 9.3 mg/dL (ref 8.9–10.3)
Chloride: 107 mmol/L (ref 101–111)
Creatinine, Ser: 0.9 mg/dL (ref 0.61–1.24)
GFR calc Af Amer: >60 mL/min (ref >60)
GFR calc non Af Amer: >60 mL/min (ref >60)
Glucose, Bld: 93 mg/dL (ref 65–99)
Potassium: 4.2 mmol/L (ref 3.5–5.1)
Sodium: 141 mmol/L (ref 135–145)
Total Bilirubin: 1 mg/dL (ref 0.3–1.2)
Total Protein: 6.2 g/dL — ABNORMAL LOW (ref 6.5–8.1)

## 2015-05-01 LAB — BASIC METABOLIC PANEL
ANION GAP: 10 (ref 5–15)
BUN: 12 mg/dL (ref 6–20)
CALCIUM: 9.2 mg/dL (ref 8.9–10.3)
CO2: 24 mmol/L (ref 22–32)
CREATININE: 0.94 mg/dL (ref 0.61–1.24)
Chloride: 105 mmol/L (ref 101–111)
GFR calc Af Amer: >60 mL/min (ref >60)
GLUCOSE: 102 mg/dL — AB (ref 65–99)
Potassium: 4.1 mmol/L (ref 3.5–5.1)
Sodium: 139 mmol/L (ref 135–145)

## 2015-05-01 LAB — CBC WITH DIFFERENTIAL/PLATELET
BASOS ABS: 0 10*3/uL (ref 0.0–0.1)
BASOS PCT: 0 %
EOS PCT: 3 %
Eosinophils Absolute: 0.2 10*3/uL (ref 0.0–0.7)
HCT: 41.7 % (ref 39.0–52.0)
Hemoglobin: 14.7 g/dL (ref 13.0–17.0)
Lymphocytes Relative: 32 %
Lymphs Abs: 2.1 10*3/uL (ref 0.7–4.0)
MCH: 29.5 pg (ref 26.0–34.0)
MCHC: 35.3 g/dL (ref 30.0–36.0)
MCV: 83.6 fL (ref 78.0–100.0)
MONO ABS: 0.5 10*3/uL (ref 0.1–1.0)
Monocytes Relative: 8 %
Neutro Abs: 3.6 10*3/uL (ref 1.7–7.7)
Neutrophils Relative %: 56 %
PLATELETS: 207 10*3/uL (ref 150–400)
RBC: 4.99 MIL/uL (ref 4.22–5.81)
RDW: 13.1 % (ref 11.5–15.5)
WBC: 6.4 10*3/uL (ref 4.0–10.5)

## 2015-05-01 LAB — I-STAT TROPONIN, ED: TROPONIN I, POC: 0 ng/mL (ref 0.00–0.08)

## 2015-05-01 LAB — MAGNESIUM: Magnesium: 2.1 mg/dL (ref 1.7–2.4)

## 2015-05-01 LAB — TROPONIN I: Troponin I: <0.03 ng/mL (ref <0.031)

## 2015-05-01 LAB — PROTIME-INR
INR: 0.99 (ref 0.00–1.49)
Prothrombin Time: 13.3 seconds (ref 11.6–15.2)

## 2015-05-01 LAB — APTT: aPTT: 28 seconds (ref 24–37)

## 2015-05-01 LAB — TSH: TSH: 2.15 u[IU]/mL (ref 0.350–4.500)

## 2015-05-01 SURGERY — LEFT HEART CATH AND CORONARY ANGIOGRAPHY
Anesthesia: LOCAL

## 2015-05-01 MED ORDER — ONDANSETRON HCL 4 MG/2ML IJ SOLN: 4.0000 mg | Freq: Four times a day (QID) | INTRAMUSCULAR | Status: DC | PRN

## 2015-05-01 MED ORDER — ACETAMINOPHEN 325 MG PO TABS: 650.0000 mg | ORAL_TABLET | ORAL | Status: DC | PRN

## 2015-05-01 MED ORDER — VERAPAMIL HCL 2.5 MG/ML IV SOLN
INTRAVENOUS | Status: AC
  Filled 2015-05-01: qty 2

## 2015-05-01 MED ORDER — HEPARIN SODIUM (PORCINE) 1000 UNIT/ML IJ SOLN
INTRAMUSCULAR | Status: DC | PRN
  Administered 2015-05-01: 5000 [IU] via INTRAVENOUS

## 2015-05-01 MED ORDER — HEPARIN (PORCINE) IN NACL 100-0.45 UNIT/ML-% IJ SOLN
1350.0000 [IU]/h | INTRAMUSCULAR | Status: DC
  Filled 2015-05-01: qty 250

## 2015-05-01 MED ORDER — NITROGLYCERIN 0.4 MG SL SUBL
0.4000 mg | SUBLINGUAL_TABLET | SUBLINGUAL | Status: DC | PRN
  Administered 2015-05-01: 0.4 mg via SUBLINGUAL
  Filled 2015-05-01: qty 1

## 2015-05-01 MED ORDER — MIDAZOLAM HCL 2 MG/2ML IJ SOLN
INTRAMUSCULAR | Status: DC | PRN
  Administered 2015-05-01 (×2): 1 mg via INTRAVENOUS

## 2015-05-01 MED ORDER — LIDOCAINE HCL (PF) 1 % IJ SOLN
INTRAMUSCULAR | Status: DC | PRN
  Administered 2015-05-01: 5 mL via INTRADERMAL

## 2015-05-01 MED ORDER — HEPARIN (PORCINE) IN NACL 2-0.9 UNIT/ML-% IJ SOLN
INTRAMUSCULAR | Status: DC | PRN
  Administered 2015-05-01: 17:00:00

## 2015-05-01 MED ORDER — IOHEXOL 350 MG/ML SOLN
INTRAVENOUS | Status: DC | PRN
  Administered 2015-05-01: 65 mL via INTRA_ARTERIAL

## 2015-05-01 MED ORDER — HEPARIN BOLUS VIA INFUSION
4000.0000 [IU] | Freq: Once | INTRAVENOUS | Status: DC
  Filled 2015-05-01: qty 4000

## 2015-05-01 MED ORDER — FENTANYL CITRATE (PF) 100 MCG/2ML IJ SOLN
INTRAMUSCULAR | Status: AC
  Filled 2015-05-01: qty 2

## 2015-05-01 MED ORDER — SODIUM CHLORIDE 0.9 % WEIGHT BASED INFUSION: 1.0000 mL/kg/h | INTRAVENOUS | Status: DC

## 2015-05-01 MED ORDER — FENTANYL CITRATE (PF) 100 MCG/2ML IJ SOLN
INTRAMUSCULAR | Status: DC | PRN
  Administered 2015-05-01 (×2): 50 ug via INTRAVENOUS

## 2015-05-01 MED ORDER — VERAPAMIL HCL 2.5 MG/ML IV SOLN
INTRAVENOUS | Status: DC | PRN
  Administered 2015-05-01: 10 mL via INTRA_ARTERIAL

## 2015-05-01 MED ORDER — HEPARIN SODIUM (PORCINE) 1000 UNIT/ML IJ SOLN
INTRAMUSCULAR | Status: AC
  Filled 2015-05-01: qty 1

## 2015-05-01 MED ORDER — SODIUM CHLORIDE 0.9% FLUSH: 3.0000 mL | Freq: Two times a day (BID) | INTRAVENOUS | Status: DC

## 2015-05-01 MED ORDER — OXYCODONE-ACETAMINOPHEN 5-325 MG PO TABS: 1.0000 | ORAL_TABLET | ORAL | Status: DC | PRN

## 2015-05-01 MED ORDER — LIDOCAINE HCL (PF) 1 % IJ SOLN
INTRAMUSCULAR | Status: AC
  Filled 2015-05-01: qty 30

## 2015-05-01 MED ORDER — HEPARIN (PORCINE) IN NACL 2-0.9 UNIT/ML-% IJ SOLN
INTRAMUSCULAR | Status: AC
  Filled 2015-05-01: qty 1000

## 2015-05-01 MED ORDER — SODIUM CHLORIDE 0.9 % IV SOLN: 250.0000 mL | INTRAVENOUS | Status: DC | PRN

## 2015-05-01 MED ORDER — SODIUM CHLORIDE 0.9% FLUSH: 3.0000 mL | INTRAVENOUS | Status: DC | PRN

## 2015-05-01 MED ORDER — MIDAZOLAM HCL 2 MG/2ML IJ SOLN
INTRAMUSCULAR | Status: AC
  Filled 2015-05-01: qty 2

## 2015-05-01 MED ORDER — SODIUM CHLORIDE 0.9 % IV SOLN
INTRAVENOUS | Status: DC | PRN
  Administered 2015-05-01: 108.9 mL via INTRAVENOUS

## 2015-05-01 SURGICAL SUPPLY — 11 items
CATH INFINITI 5 FR JL3.5 (CATHETERS) ×2 IMPLANT
CATH INFINITI JR4 5F (CATHETERS) ×2 IMPLANT
DEVICE RAD COMP TR BAND LRG (VASCULAR PRODUCTS) ×2 IMPLANT
GLIDESHEATH SLEND A-KIT 6F 22G (SHEATH) ×2 IMPLANT
KIT HEART LEFT (KITS) ×2 IMPLANT
PACK CARDIAC CATHETERIZATION (CUSTOM PROCEDURE TRAY) ×2 IMPLANT
TRANSDUCER W/STOPCOCK (MISCELLANEOUS) ×2 IMPLANT
TUBING CIL FLEX 10 FLL-RA (TUBING) ×2 IMPLANT
TUBING CONTRAST HIGH PRESS 20 (MISCELLANEOUS) ×2 IMPLANT
VALVE MANIFOLD 3 PORT W/RA/ON (MISCELLANEOUS) ×2 IMPLANT
WIRE SAFE-T 1.5MM-J .035X260CM (WIRE) ×2 IMPLANT

## 2015-05-01 NOTE — H&P (Signed)
Gavin Parker  05/01/2015 10:00 AM  Office Visit  MRN:  [id number]   Description: Male DOB: 02/21/1966  Provider: Burtis Junes, NP  Department: [facility] [facility]       Vital Signs  Most recent update: 05/01/2015 10:25 AM by Tamsen Snider    BP Pulse Ht Wt BMI    148/90 mmHg 70 5\' 11"  (1.803 m) 240 lb 1.9 oz (108.918 kg) 33.50 kg/m2    Vitals History     Progress Notes      Burtis Junes, NP at 05/01/2015 10:13 AM     Status: Signed       Expand All Collapse All       CARDIOLOGY OFFICE NOTE  Date: 05/01/2015    Gavin Parker Date of Birth: June 30, 1965 Medical Record [id number]  PCP: Luetta Nutting, DO Cardiologist: [facility]   Chief Complaint  Patient presents with  . Coronary Artery Disease  . Chest Pain    Work in visit - seen for Dr. Stanford Breed    History of Present Illness: Gavin Parker is a 50 y.o. male who presents today for a work in visit. Seen for Dr. Stanford Breed.  He has not been seen since July of 2015.   He has CAD - non obstructive per cath from 2012, prior testicular cancer, GERD, HLD and hypothyroidism. He had an abnormal cardiac CTA 7/12 suggested significant coronary disease. Cardiac catheterization was performed in July of 2012 and revealed normal LM; normal Lcx; 20% mid LAD followed by a 40% lesion; 20 % distal LAD; 40 % proximal RCA; normal LV function. Echocardiogram June 2015 showed normal LV function. Nuclear study June 2015 showed mild inferior septal defect that was partially reversible but not felt significant for ischemia.  Comes in today. Here alone. He tells me that he had foot surgery back in November - very slow to recover. Got pretty sedentary. Started getting short of breath - ended up with pneumonia. Persistent breathing issues - treated for bronchitis. Having chest and arm pain in the middle of last week. This past Friday he started having chest pain radiating down the left shoulder/arm - he  took NTG with prompt relief. Ended up back at [facility] ER - given NTG patch. Troponin was negative. CBC ok. Chemistries stable. He has had some improvement in his symptoms but they still return. Comes in here and having active left shoulder pain - responsive to NTG. BP has been running up - this is unusual for him. He continues to have shortness of breath - has had excessive sweats - mostly with exertion. Little nausea at night. No vomiting. Admits to "bad eating".   Past Medical History  Diagnosis Date  . 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 Last visit w/ urology June 2011 ([place]); last CT of the abdomen -chest in [place]: 10/11  . GERD (gastroesophageal reflux disease)   . Hyperlipidemia   . Hypothyroidism   . CAD (coronary artery disease)     non obstructive per cath 09-2010  . Anginal pain [facility])     Past Surgical History  Procedure Laterality Date  . Orchiectomy      R  . Umbilical hernia repair    . Tonsillectomy    . Cardiac catheterization       Medications: Current Outpatient Prescriptions  Medication Sig Dispense Refill  . albuterol (VENTOLIN HFA) 108 (90 BASE) MCG/ACT inhaler Inhale 2 puffs into the lungs every 4 (four) hours as needed  for wheezing or shortness of breath.     Marland Kitchen aspirin 81 MG tablet Take 81 mg by mouth daily.     . cetirizine (ZYRTEC) 10 MG tablet Take 1 tablet (10 mg total) by mouth daily. 30 tablet 6  . fluticasone (FLONASE) 50 MCG/ACT nasal spray 1 spray in each nostril twice a day as needed. Use the "crossover" technique as discussed    . lansoprazole (PREVACID) 30 MG capsule TAKE ONE CAPSULE BY MOUTH ONCE DAILY    . levothyroxine (SYNTHROID, LEVOTHROID) 112 MCG tablet Take 112 mcg by mouth daily before breakfast.    . montelukast (SINGULAIR) 10 MG tablet Take 10 mg by mouth at bedtime.    . nitroGLYCERIN (NITROSTAT) 0.4 MG SL  tablet Place 1 tablet (0.4 mg total) under the tongue every 5 (five) minutes as needed for chest pain (or arm pain). 30 tablet 12  . pantoprazole (PROTONIX) 40 MG tablet Take 40 mg by mouth daily.    . simvastatin (ZOCOR) 40 MG tablet Take 40 mg by mouth daily.     No current facility-administered medications for this visit.    Allergies: Allergies  Allergen Reactions  . Atorvastatin Itching  . Doxycycline Diarrhea    Nausea, diarrhea  . Gabapentin Itching  . Pravastatin Itching and Other (See Comments)    Body aching   . Rosuvastatin Other (See Comments)    Muscle pain  . Pantoprazole Nausea And Vomiting    Social History: The patient  reports that he quit smoking about 3 years ago. He has never used smokeless tobacco. He reports that he drinks alcohol. He reports that he does not use illicit drugs.  Family History: The patient's family history includes Arthritis in his father; Cancer in his maternal grandfather; Diabetes in his mother; Ovarian cancer in his mother; Pancreatic cancer in his father.   Review of Systems: Please see the history of present illness. Otherwise, the review of systems is positive for none. All other systems are reviewed and negative.   Physical Exam: VS: BP 148/90 mmHg  Pulse 70  Ht 5\' 11"  (1.803 m)  Wt 240 lb 1.9 oz (108.918 kg)  BMI 33.50 kg/m2 . BMI Body mass index is 33.5 kg/(m^2).  Wt Readings from Last 3 Encounters:  05/01/15 240 lb 1.9 oz (108.918 kg)  09/10/14 240 lb (108.863 kg)  10/13/13 234 lb 1.6 oz (106.187 kg)    General: Pleasant. Well developed, well nourished and in no acute distress. He is complaining of active left shoulder pain. Little anxious.  HEENT: Normal.  Neck: Supple, no JVD, carotid bruits, or masses noted.  Cardiac: Regular rate and rhythm. No murmurs, rubs, or gallops. No edema.  Respiratory: Lungs are clear to auscultation bilaterally with  normal work of breathing.  GI: Soft and nontender.  MS: No deformity or atrophy. Gait and ROM intact.  Skin: Warm and dry. Color is normal.  Neuro: Strength and sensation are intact and no gross focal deficits noted.  Psych: Alert, appropriate and with normal affect.   LABORATORY DATA:  EKG: EKG is ordered today. This demonstrates NSR with no acute changes. Reviewed with Dr. Lovena Le (DOD).   Recent Labs    Lab Results  Component Value Date   WBC 8.6 09/11/2014   HGB 15.1 09/11/2014   HCT 43.5 09/11/2014   PLT 228 09/11/2014   GLUCOSE 114* 09/11/2014   CHOL 121 09/21/2012   TRIG 143 09/21/2012   HDL 33* 09/21/2012   LDLCALC 59 09/21/2012  ALT 51 09/11/2014   AST 34 09/11/2014   NA 137 09/11/2014   K 4.0 09/11/2014   CL 103 09/11/2014   CREATININE 1.09 09/11/2014   BUN 13 09/11/2014   CO2 25 09/11/2014   TSH 1.88 07/14/2011   PSA 1.27 07/14/2011   INR 1.04 09/20/2012   HGBA1C 5.6 07/18/2011      BNP (last 3 results)  Recent Labs (within last 365 days)    No results for input(s): BNP in the last 8760 hours.    ProBNP (last 3 results)  Recent Labs (within last 365 days)    No results for input(s): PROBNP in the last 8760 hours.     Other Studies Reviewed Today:  Echo Study Conclusions from 08/2013  - Left ventricle: The cavity size was normal. Systolic function was normal. The estimated ejection fraction was in the range of 60% to 65%. Wall motion was normal; there were no regional wall motion abnormalities.   Myoview Impression from 08/2013 Exercise Capacity: Excellent exercise capacity. BP Response: Normal blood pressure response. Clinical Symptoms: No significant symptoms noted. ECG Impression: No significant ST segment change suggestive of ischemia. Comparison with Prior Nuclear Study: No significant change from previous study  Overall Impression:  Low risk stress nuclear study with mild inferoseptal defect that is partially reversible but not significant for ischemia.  LV Wall Motion: Inferoseptal hypokinesis - EF 48%.  Compared to the prior study in 2014, EF has dropped from 60 -> 48%, there are subtle, but not significant ischemic changes. He has known CAD in the LAD and RCA in 2012 by cath. Consideration for LHC should be given.  Pixie Casino, MD, [facility]   Assessment/Plan: 1. Unstable angina - discussed and subsequently seen with Dr. Lovena Le - will need to admit for further intervention. Needs cardiac catheterization - try to arrange for today - he has basically been NPO. The procedure has been discussed and he is willing to proceed. Transported by EMS to the [facility] ER. He was given NTG x 1 (his own supply) and 3 additional baby aspirins.   2. CAD - as above  3. Elevated BP   4. HLD - on Zocor  Current medicines are reviewed with the patient today. The patient does not have concerns regarding medicines other than what has been noted above.  The following changes have been made: See above.  Labs/ tests ordered today include:   No orders of the defined types were placed in this encounter.    Disposition: Further disposition to follow.   Patient is agreeable to this plan and will call if any problems develop in the interim.   Signed: Burtis Junes, RN, ANP-C 05/01/2015 10:55 AM  [facility] [address] [place], [place] [address] Phone: [phone] Fax: [phone]                   Referring Provider     Burtis Junes, NP     Diagnoses     CAD in native artery - Primary    ICD-9-CM: 414.01 ICD-10-CM: I25.10    Ischemic chest pain ([place])     ICD-9-CM: 786.50 ICD-10-CM: I20.9    HLD (hyperlipidemia)     ICD-9-CM: 272.4 ICD-10-CM: E78.5       Reason for Visit     Coronary Artery Disease    Chest Pain     Work in visit - seen for Dr. Stanford Breed    Reason for  Visit History        Discontinued Medications       Reason for Discontinue    Alum & Mag Hydroxide-Simeth (MAGIC MOUTHWASH) SOLN Completed Course    dicyclomine (BENTYL) 10 MG capsule Completed Course    ondansetron (ZOFRAN) 4 MG tablet Change in therapy      Medication Notes     lansoprazole (PREVACID) 30 MG capsule     Tamsen Snider 05/01/2015 10:18 AM Received from: [place]        Level of Service     PR OFFICE OUTPATIENT VISIT 40 MINUTES [99215]      Follow-up and Disposition     Routing History       All Charges for This Encounter     Code Description Service Date Service Provider Modifiers Qty    [place], COMPLETE 05/01/2015 Burtis Junes, NP  1    [phone] PR OFFICE OUTPATIENT VISIT 40 MINUTES 05/01/2015 Burtis Junes, NP  1      Routing History     From: Burtis Junes, NP On: 05/01/2015 10:59 AM    To: Lelon Perla, MD    Priority: Routine      AVS Reports     No AVS Snapshots are available for this encounter.     Patient Instructions     We are admitting you to the hospital today.        Routing History     There are no sent or routed communications associated with this encounter.     Previous Visit       Provider Department Encounter #    09/11/2014 2:40 AM Truitt Merle, NP [id number]      Cardiology Attending  Patient seen and examined. Agree with the findings above. He has developed unstable angina. His exam reveals a RRR and his lungs are clear. Extremities are without edema. ECG is non-acute. Will plan to admit for left heart cath.  Mikle Bosworth.D.

## 2015-05-01 NOTE — Discharge Instructions (Signed)

## 2015-05-01 NOTE — H&P (View-Only) (Signed)
CARDIOLOGY OFFICE NOTE  Date:  05/01/2015    [name] Date of Birth: 09-Feb-1966 Medical Record [id number]  PCP:  Luetta Nutting, DO  Cardiologist:  [facility]    Chief Complaint  Patient presents with  . Coronary Artery Disease  . Chest Pain    Work in visit - seen for Dr. Stanford Breed    History of Present Illness: Gavin Parker is a 50 y.o. male who presents today for a work in visit. Seen for Dr. Stanford Breed.  He has not been seen since July of 2015.   He has CAD - non obstructive per cath from 2012, prior testicular cancer, GERD, HLD and hypothyroidism.  He had an abnormal cardiac CTA 7/12 suggested significant coronary disease. Cardiac catheterization was performed in July of 2012 and revealed normal LM; normal Lcx; 20% mid LAD followed by a 40% lesion; 20 % distal LAD; 40 % proximal RCA; normal LV function. Echocardiogram June 2015 showed normal LV function. Nuclear study June 2015 showed mild inferior septal defect that was partially reversible but not felt significant for ischemia.  Comes in today. Here alone. He tells me that he had foot surgery back in November - very slow to recover. Got pretty sedentary. Started getting short of breath - ended up with pneumonia. Persistent breathing issues - treated for bronchitis. Having chest and arm pain in the middle of last week. This past Friday he started having chest pain radiating down the left shoulder/arm - he took NTG with prompt relief. Ended up back at [facility] ER - given NTG patch. Troponin was negative. CBC ok. Chemistries stable. He has had some improvement in his symptoms but they still return. Comes in here and having active left shoulder pain - responsive to NTG. BP has been running up - this is unusual for him. He continues to have shortness of breath - has had excessive sweats - mostly with exertion. Little nausea at night. No vomiting. Admits to "bad eating".   Past Medical History  Diagnosis Date  . 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 Last visit w/ urology June 2011 ([place]); last CT of  the abdomen -chest in [place]:  10/11  . GERD (gastroesophageal reflux disease)   . Hyperlipidemia   . Hypothyroidism   . CAD (coronary artery disease)     non obstructive per cath 09-2010  . Anginal pain [facility])     Past Surgical History  Procedure Laterality Date  . Orchiectomy      R  . Umbilical hernia repair    . Tonsillectomy    . Cardiac catheterization       Medications: Current Outpatient Prescriptions  Medication Sig Dispense Refill  . albuterol (VENTOLIN HFA) 108 (90 BASE) MCG/ACT inhaler Inhale 2 puffs into the lungs every 4 (four) hours as needed for wheezing or shortness of breath.     Marland Kitchen aspirin 81 MG tablet Take 81 mg by mouth daily.      . cetirizine (ZYRTEC) 10 MG tablet Take 1 tablet (10 mg total) by mouth daily. 30 tablet 6  . fluticasone (FLONASE) 50 MCG/ACT nasal spray 1 spray in each nostril twice a day as needed. Use the "crossover" technique as discussed    . lansoprazole (PREVACID) 30 MG capsule TAKE ONE CAPSULE BY MOUTH ONCE DAILY    . levothyroxine (SYNTHROID, LEVOTHROID) 112 MCG tablet Take 112 mcg by mouth daily before breakfast.    . montelukast (SINGULAIR) 10 MG  tablet Take 10 mg by mouth at bedtime.    . nitroGLYCERIN (NITROSTAT) 0.4 MG SL tablet Place 1 tablet (0.4 mg total) under the tongue every 5 (five) minutes as needed for chest pain (or arm pain). 30 tablet 12  . pantoprazole (PROTONIX) 40 MG tablet Take 40 mg by mouth daily.    . simvastatin (ZOCOR) 40 MG tablet Take 40 mg by mouth daily.     No current facility-administered medications for this visit.    Allergies: Allergies  Allergen Reactions  . Atorvastatin Itching  . Doxycycline Diarrhea    Nausea, diarrhea  . Gabapentin Itching  . Pravastatin Itching and Other (See Comments)    Body aching   . Rosuvastatin Other (See Comments)    Muscle pain  . Pantoprazole Nausea And  Vomiting    Social History: The patient  reports that he quit smoking about 3 years ago. He has never used smokeless tobacco. He reports that he drinks alcohol. He reports that he does not use illicit drugs.   Family History: The patient's family history includes Arthritis in his father; Cancer in his maternal grandfather; Diabetes in his mother; Ovarian cancer in his mother; Pancreatic cancer in his father.   Review of Systems: Please see the history of present illness.   Otherwise, the review of systems is positive for none.   All other systems are reviewed and negative.   Physical Exam: VS:  BP 148/90 mmHg  Pulse 70  Ht 5\' 11"  (1.803 m)  Wt 240 lb 1.9 oz (108.918 kg)  BMI 33.50 kg/m2 .  BMI Body mass index is 33.5 kg/(m^2).  Wt Readings from Last 3 Encounters:  05/01/15 240 lb 1.9 oz (108.918 kg)  09/10/14 240 lb (108.863 kg)  10/13/13 234 lb 1.6 oz (106.187 kg)    General: Pleasant. Well developed, well nourished and in no acute distress. He is complaining of active left shoulder pain. Little anxious.   HEENT: Normal. Neck: Supple, no JVD, carotid bruits, or masses noted.  Cardiac: Regular rate and rhythm. No murmurs, rubs, or gallops. No edema.  Respiratory:  Lungs are clear to auscultation bilaterally with normal work of breathing.  GI: Soft and nontender.  MS: No deformity or atrophy. Gait and ROM intact. Skin: Warm and dry. Color is normal.  Neuro:  Strength and sensation are intact and no gross focal deficits noted.  Psych: Alert, appropriate and with normal affect.   LABORATORY DATA:  EKG:  EKG is ordered today. This demonstrates NSR with no acute changes. Reviewed with Dr. Lovena Le (DOD).  Lab Results  Component Value Date   WBC 8.6 09/11/2014   HGB 15.1 09/11/2014   HCT 43.5 09/11/2014   PLT 228 09/11/2014   GLUCOSE 114* 09/11/2014   CHOL 121 09/21/2012   TRIG 143 09/21/2012   HDL 33* 09/21/2012   LDLCALC 59 09/21/2012   ALT 51 09/11/2014   AST 34  09/11/2014   NA 137 09/11/2014   K 4.0 09/11/2014   CL 103 09/11/2014   CREATININE 1.09 09/11/2014   BUN 13 09/11/2014   CO2 25 09/11/2014   TSH 1.88 07/14/2011   PSA 1.27 07/14/2011   INR 1.04 09/20/2012   HGBA1C 5.6 07/18/2011    BNP (last 3 results) No results for input(s): BNP in the last 8760 hours.  ProBNP (last 3 results) No results for input(s): PROBNP in the last 8760 hours.   Other Studies Reviewed Today:  Echo Study Conclusions from 08/2013  - Left ventricle:  The cavity size was normal. Systolic function was normal. The estimated ejection fraction was in the range of 60% to 65%. Wall motion was normal; there were no regional wall motion abnormalities.   Myoview Impression from 08/2013 Exercise Capacity: Excellent exercise capacity. BP Response: Normal blood pressure response. Clinical Symptoms: No significant symptoms noted. ECG Impression: No significant ST segment change suggestive of ischemia. Comparison with Prior Nuclear Study: No significant change from previous study  Overall Impression: Low risk stress nuclear study with mild inferoseptal defect that is partially reversible but not significant for ischemia.  LV Wall Motion: Inferoseptal hypokinesis - EF 48%.  Compared to the prior study in 2014, EF has dropped from 60 -> 48%, there are subtle, but not significant ischemic changes. He has known CAD in the LAD and RCA in 2012 by cath. Consideration for LHC should be given.  Pixie Casino, MD, [facility]   Assessment/Plan: 1. Unstable angina - discussed and subsequently seen with Dr. Lovena Le - will need to admit for further intervention. Needs cardiac catheterization - try to arrange for today - he has basically been NPO. The procedure has been discussed and he is willing to proceed. Transported by EMS to the [facility]. He was given NTG x 1 (his own supply) and 3 additional baby aspirins.   2. CAD - as above  3. Elevated BP   4.  HLD - on  Zocor  Current medicines are reviewed with the patient today.  The patient does not have concerns regarding medicines other than what has been noted above.  The following changes have been made:  See above.  Labs/ tests ordered today include:   No orders of the defined types were placed in this encounter.     Disposition:   Further disposition to follow.   Patient is agreeable to this plan and will call if any problems develop in the interim.   Signed: Burtis Junes, RN, ANP-C 05/01/2015 10:55 AM  [place] [address] [place], [place]  [address] Phone: [phone] Fax: [phone]

## 2015-05-01 NOTE — ED Notes (Signed)
PA rose at bedside

## 2015-05-01 NOTE — Progress Notes (Signed)
Paged Dr Tamala Julian for post TR band orders

## 2015-05-01 NOTE — Interval H&P Note (Signed)
Cath Lab Visit (complete for each Cath Lab visit)  Clinical Evaluation Leading to the Procedure:   ACS: Yes.    Non-ACS:    Anginal Classification: CCS III  Anti-ischemic medical therapy: Minimal Therapy (1 class of medications)  Non-Invasive Test Results: No non-invasive testing performed  Prior CABG: No previous CABG      History and Physical Interval Note:  05/01/2015 2:00 PM  Gavin Parker  has presented today for surgery, with the diagnosis of cp  The various methods of treatment have been discussed with the patient and family. After consideration of risks, benefits and other options for treatment, the patient has consented to  Procedure(s): Left Heart Cath and Coronary Angiography (N/A) as a surgical intervention .  The patient's history has been reviewed, patient examined, no change in status, stable for surgery.  I have reviewed the patient's chart and labs.  Questions were answered to the patient's satisfaction.     Gavin Parker

## 2015-05-01 NOTE — Progress Notes (Signed)
ANTICOAGULATION CONSULT NOTE - Initial Consult  Pharmacy Consult for Heparin Indication: chest pain/ACS  Allergies  Allergen Reactions  . Atorvastatin Itching  . Doxycycline Diarrhea    Nausea, diarrhea  . Gabapentin Itching  . Pravastatin Itching and Other (See Comments)    Body aching   . Rosuvastatin Other (See Comments)    Muscle pain  . Pantoprazole Nausea And Vomiting    Patient Measurements: Height: 5\' 11"  (180.3 cm) Weight: 240 lb (108.863 kg) IBW/kg (Calculated) : 75.3 Heparin Dosing Weight: 98.5 kg  Vital Signs: Temp: 98.2 F (36.8 C) (01/31 1141) Temp Source: Oral (01/31 1141) BP: 103/83 mmHg (01/31 1430) Pulse Rate: 60 (01/31 1430)  Labs:  Recent Labs  05/01/15 1148  HGB 14.7  HCT 41.7  PLT 207  LABPROT 13.3  INR 0.99  CREATININE 0.94    Estimated Creatinine Clearance: 116.6 mL/min (by C-G formula based on Cr of 0.94).   Medical History: Past Medical History  Diagnosis Date  . 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 Last visit w/ urology June 2011 ([place]); last CT of  the abdomen -chest in [place]:  10/11  . GERD (gastroesophageal reflux disease)   . Hyperlipidemia   . Hypothyroidism   . CAD (coronary artery disease)     non obstructive per cath 09-2010  . Anginal pain ([place])   . High cholesterol     Medications:   (Not in a hospital admission) Scheduled:   Infusions:    Assessment: 50yo M w/ hx of testicular cancer, GERD, HLD, Hypothyroidism, CAD, anginal pain. Presents w/ 6 day hx of constant, moderate, worsening chest pain radiating down L arm. Pharmacy consulted to dose Heparin for ACS. hgb 14.7, Plts 207, sCr 0.94, CrCl 116.6, no s/sx of bleeding, no PTA AC meds.  Goal of Therapy:  Heparin level 0.3-0.7 units/ml Monitor platelets by anticoagulation protocol: Yes   Plan:  Give 4000 units bolus x 1 Start heparin infusion at 1350 units/hr Check anti-Xa level in 6 hours and daily while on  heparin Continue to monitor H&H and platelets  Georgiann Mohs, PharmD Student  05/01/2015,2:45 PM

## 2015-05-01 NOTE — ED Notes (Addendum)
Pt arrives via [facility] from [facility], for c/o chest pain and left arm pain that patient reports has been intermittent since Wednesday. Pt was seen at [facility] on Friday for the same and discharged home. Pt reports he was told he would have a cardiac cath later today. Pt received 324mg  asa and 1 sl nitro pta. Pt a/o, resp e/u.

## 2015-05-01 NOTE — Patient Instructions (Signed)
We are admitting you to the hospital today. 

## 2015-05-01 NOTE — Progress Notes (Signed)
Per Aaron Edelman in cath lab Dr Tamala Julian to place post TR band orders

## 2015-05-01 NOTE — Discharge Summary (Signed)
Discharge Summary    Patient ID: Gavin Parker,  MRN: [id number], DOB/AGE: 1965/12/12 50 y.o.  Admit date: 05/01/2015 Discharge date: 05/01/2015  Primary Care Provider: Luetta Nutting Primary Cardiologist: Dr. Stanford Breed   Discharge Diagnoses    Principal Problem:   Pain in the chest Active Problems:   HLD (hyperlipidemia)   GERD   CAD in native artery   HTN (hypertension)   Allergies Allergies  Allergen Reactions  . Atorvastatin Itching  . Doxycycline Diarrhea    Nausea, diarrhea  . Gabapentin Itching  . Pravastatin Itching and Other (See Comments)    Body aching   . Rosuvastatin Other (See Comments)    Muscle pain  . Pantoprazole Nausea And Vomiting     History of Present Illness     Gavin Parker is a 50 y.o. With a history of non obst CAD, HTN, HLD, GERD, prior testicular cancer and hypothyroidism who was admitted to [facility] from the office on 1/05/30/15 with chest pain and coronary angiography.   He has not been seen since July of 2015.He has CAD - non obstructive per cath from 2012. He had an abnormal cardiac CTA 7/12 suggested significant coronary disease. Cardiac catheterization was performed in July of 2012 and revealed normal LM; normal Lcx; 20% mid LAD followed by a 40% lesion; 20 % distal LAD; 40 % proximal RCA; normal LV function. Echocardiogram June 2015 showed normal LV function. Nuclear study June 2015 showed mild inferior septal defect that was partially reversible but not felt significant for ischemia.  He was seen in the office on 1/05/30/15 as an add on for chest pain. He was having chest and arm pain in the middle of last week. This past Friday he started having chest pain radiating down the left shoulder/arm - he took NTG with prompt relief. Ended up back at [facility] ER - given NTG patch. Troponin was negative. CBC ok. Chemistries stable. He has had some improvement in his symptoms but they still return. Comes in here and having active left shoulder pain -  responsive to NTG. BP has been running up - this is unusual for him. He continues to have shortness of breath - has had excessive sweats - mostly with exertion.  Hospital Course     Consultants: none  1. Chest pain: s/p cardiac catheterization with non obst CAD: "Widely patent coronary arteries with minimal atherosclerosis but no lesion greater than 40%. Normal LV function. No change in anatomy compared to prior angiogram"  2. HTN: BP stable. Continue current regmien   3. HLD: on Zocor  The patient has had an uncomplicated hospital course and is recovering well. The radial catheter site is stable. He has been seen by Dr. Tamala Julian today and deemed ready for discharge home. All follow-up appointments have been scheduled.  Discharge medications are listed below.  _____________  Discharge Vitals Blood pressure 115/75, pulse 76, temperature 98.3 F (36.8 C), temperature source Oral, resp. rate 0, height 5\' 11"  (1.803 m), weight 240 lb (108.863 kg), SpO2 97 %.  Filed Weights   05/01/15 1141  Weight: 240 lb (108.863 kg)    Labs & Radiologic Studies     CBC  Recent Labs  05/01/15 1148  WBC 6.4  NEUTROABS 3.6  HGB 14.7  HCT 41.7  MCV 83.6  PLT [id number]   Basic Metabolic Panel  Recent Labs  05/01/15 1148 05/01/15 1521  NA 139 141  K 4.1 4.2  CL 105 107  CO2 24 25  GLUCOSE 102* 93  BUN 12 11  CREATININE 0.94 0.90  CALCIUM 9.2 9.3  MG  --  2.1   Liver Function Tests  Recent Labs  05/01/15 1521  AST 30  ALT 49  ALKPHOS 94  BILITOT 1.0  PROT 6.2*  ALBUMIN 4.0   No results for input(s): LIPASE, AMYLASE in the last 72 hours. Cardiac Enzymes  Recent Labs  05/01/15 1521  TROPONINI <0.03   Thyroid Function Tests  Recent Labs  05/01/15 1521  TSH 2.150    Dg Chest 2 View  05/01/2015  CLINICAL DATA:  Chest and left arm pain since Wednesday of last week. EXAM: CHEST  2 VIEW COMPARISON:  09/11/2014 FINDINGS: The heart size and mediastinal contours are within  normal limits. Both lungs are clear. The visualized skeletal structures are unremarkable. IMPRESSION: Normal chest x-ray. Electronically Signed   By: Marijo Sanes M.D.   On: 05/01/2015 13:00     Diagnostic Studies/Procedures     05/01/15 Procedures    Left Heart Cath and Coronary Angiography    Conclusion     Prox RCA lesion, 30% stenosed.  Mid LAD lesion, 40% stenosed.  Prox Cx to Dist Cx lesion, 20% stenosed.   Widely patent coronary arteries with minimal atherosclerosis but no lesion greater than 40%.  Normal left ventricular function  No change in anatomy we compared to prior angiogram Recommendations:  Consider other diagnoses to explain the patient's symptoms.       _____________    Disposition   Pt is being discharged home today in good condition.  Follow-up Plans & Appointments    Follow-up Information    Follow up with Kirk Ruths, MD.   Specialty:  Cardiology   Why:  The office will call you to make an appoinment., If you do not hear from them, please contact them., You should be seen within 1-2 months   Contact information:   [address] [place] [address] [phone]        Discharge Medications   Current Discharge Medication List    CONTINUE these medications which have NOT CHANGED   Details  albuterol (VENTOLIN HFA) 108 (90 BASE) MCG/ACT inhaler Inhale 2 puffs into the lungs every 4 (four) hours as needed for wheezing or shortness of breath.     aspirin 81 MG tablet Take 81 mg by mouth daily.      cetirizine (ZYRTEC) 10 MG tablet Take 1 tablet (10 mg total) by mouth daily. Qty: 30 tablet, Refills: 6    fluticasone (FLONASE) 50 MCG/ACT nasal spray 1 spray in each nostril twice a day as needed. Use the "crossover" technique as discussed    lansoprazole (PREVACID) 30 MG capsule TAKE ONE CAPSULE BY MOUTH ONCE DAILY    levothyroxine (SYNTHROID, LEVOTHROID) 112 MCG tablet Take 112 mcg by mouth daily before  breakfast.    montelukast (SINGULAIR) 10 MG tablet Take 10 mg by mouth at bedtime.    nitroGLYCERIN (NITROSTAT) 0.4 MG SL tablet Place 1 tablet (0.4 mg total) under the tongue every 5 (five) minutes as needed for chest pain (or arm pain). Qty: 30 tablet, Refills: 12    simvastatin (ZOCOR) 40 MG tablet Take 40 mg by mouth daily.           Outstanding Labs/Studies   Duration of Discharge Encounter   Greater than 30 minutes including physician time.  Mable Fill R PA-C 05/01/2015, 7:02 PM

## 2015-05-01 NOTE — ED Provider Notes (Signed)
CSN: [id number]     Arrival date & time 05/01/15  1135 History   First MD Initiated Contact with Patient 05/01/15 1137     Chief Complaint  Patient presents with  . Chest Pain     (Consider location/radiation/quality/duration/timing/severity/associated sxs/prior Treatment) HPI   Zequan Popko is a 50 y.o. male with PMH significant for testicular cancer, GERD, HLD, Hypothyroidism, CAD, anginal pain who presents with 6 day history of constant, moderate, worsening chest pain that radiates down left arm.  Relieved by NTG.  Aggravating factors include exertion.  Assoc sxs include DOE, SOB, and nausea.  Denies vomiting, fever, abdominal pain, urinary symptoms, or headache.  Patient has had 324 mg ASA and one NTG PTA which have alleviated his CP.  Patient reports he was seen at [facility] ED with unremarkable workup and told to follow up with his cardiologist.  Patient was at cardiologist office today for this CP and was diagnosed with unstable angina, and sent to ED.  Patient is to have a cardiac cath later today.  His last cath was in 2012 and remarkable for non-obstructive CAD.  He does not smoke.     Past Medical History  Diagnosis Date  . 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 Last visit w/ urology June 2011 ([place]); last CT of  the abdomen -chest in [place]:  10/11  . GERD (gastroesophageal reflux disease)   . Hyperlipidemia   . Hypothyroidism   . CAD (coronary artery disease)     non obstructive per cath 09-2010  . Anginal pain ([place])   . High cholesterol    Past Surgical History  Procedure Laterality Date  . Orchiectomy      R  . Umbilical hernia repair    . Tonsillectomy    . Cardiac catheterization     Family History  Problem Relation Age of Onset  . Arthritis Father   . Diabetes Mother   . Ovarian cancer Mother   . Pancreatic cancer Father   . Cancer Maternal Grandfather     metastatic    Social History  Substance Use Topics  . Smoking  status: Former Smoker    Quit date: 03/31/2012  . Smokeless tobacco: Never Used  . Alcohol Use: Yes     Comment: 2 per week    Review of Systems  All other systems negative unless otherwise stated in HPI   Allergies  Atorvastatin; Doxycycline; Gabapentin; Pravastatin; Rosuvastatin; and Pantoprazole  Home Medications   Prior to Admission medications   Medication Sig Start Date End Date Taking? Authorizing Provider  albuterol (VENTOLIN HFA) 108 (90 BASE) MCG/ACT inhaler Inhale 2 puffs into the lungs every 4 (four) hours as needed for wheezing or shortness of breath.    Yes Historical Provider, MD  aspirin 81 MG tablet Take 81 mg by mouth daily.     Yes Historical Provider, MD  cetirizine (ZYRTEC) 10 MG tablet Take 1 tablet (10 mg total) by mouth daily. 07/23/10  Yes Colon Branch, MD  fluticasone [facility]) 50 MCG/ACT nasal spray 1 spray in each nostril twice a day as needed. Use the "crossover" technique as discussed 11/22/10  Yes Hendricks Limes, MD  lansoprazole (PREVACID) 30 MG capsule TAKE ONE CAPSULE BY MOUTH ONCE DAILY 04/03/15  Yes Historical Provider, MD  levothyroxine (SYNTHROID, LEVOTHROID) 112 MCG tablet Take 112 mcg by mouth daily before breakfast.   Yes Historical Provider, MD  montelukast (SINGULAIR) 10 MG tablet Take 10 mg  by mouth at bedtime.   Yes Historical Provider, MD  nitroGLYCERIN (NITROSTAT) 0.4 MG SL tablet Place 1 tablet (0.4 mg total) under the tongue every 5 (five) minutes as needed for chest pain (or arm pain). 09/21/12  Yes Ripudeep Krystal Eaton, MD  simvastatin (ZOCOR) 40 MG tablet Take 40 mg by mouth daily.   Yes Historical Provider, MD   BP 117/75 mmHg  Pulse 71  Temp(Src) 98.2 F (36.8 C) (Oral)  Resp 18  Ht 5\' 11"  (1.803 m)  Wt 108.863 kg  BMI 33.49 kg/m2  SpO2 95% Physical Exam  Constitutional: He is oriented to person, place, and time. He appears well-developed and well-nourished.  Non-toxic appearance. He does not have a sickly appearance. He does not  appear ill.  HENT:  Head: Normocephalic and atraumatic.  Mouth/Throat: Oropharynx is clear and moist.  Eyes: Conjunctivae are normal. Pupils are equal, round, and reactive to light.  Neck: Normal range of motion. Neck supple.  Cardiovascular: Normal rate, regular rhythm and normal heart sounds.   No murmur heard. Pulmonary/Chest: Effort normal and breath sounds normal. No accessory muscle usage or stridor. No respiratory distress. He has no wheezes. He has no rhonchi. He has no rales.  Abdominal: Soft. Bowel sounds are normal. He exhibits no distension. There is no tenderness.  Musculoskeletal: Normal range of motion.  Lymphadenopathy:    He has no cervical adenopathy.  Neurological: He is alert and oriented to person, place, and time.  Speech clear without dysarthria.  Skin: Skin is warm and dry.  Psychiatric: He has a normal mood and affect. His behavior is normal.    ED Course  Procedures (including critical care time) Labs Review Labs Reviewed  CBC WITH DIFFERENTIAL/PLATELET  BASIC METABOLIC PANEL  [place], ED    Imaging Review No results found. I have personally reviewed and evaluated these images and lab results as part of my medical decision-making.   EKG Interpretation None      MDM   Final diagnoses:  Unstable angina (HCC)  Chest pain, unspecified chest pain type    Patient presents with symptoms consistent with unstable angina sent by cardiologist for [facility] later today. Recent negative workup at [facility], including negative chest CTA.  He followed up with his cardiologist, Dr. Stanford Breed, today who scheduled LHC this afternoon with Dr. Tamala Julian.  EKG shows NSR with RSR' in V1 or V2, no acute changes.  Troponin 0.00.  CBC and BMP unremarkable.  Cardiology consulted for admission for unstable angina and LHC. Case has been discussed with Dr. Claudine Mouton who agrees with the above plan for admission.      Gloriann Loan, PA-C 05/01/15 1251  Everlene Balls,  MD 05/01/15 2223

## 2015-05-01 NOTE — ED Notes (Signed)
Cath lab called stating they are ready for patient, CAth lab made aware that heparin was just brought to this RN from pharmacy, cath lab advised to hold heparin at this time

## 2015-05-01 NOTE — Progress Notes (Signed)
CARDIOLOGY OFFICE NOTE  Date:  05/01/2015    Gavin Parker Date of Birth: 1966/03/31 Medical Record [id number]  PCP:  Luetta Nutting, DO  Cardiologist:  [facility]    Chief Complaint  Patient presents with  . Coronary Artery Disease  . Chest Pain    Work in visit - seen for Dr. Stanford Breed    History of Present Illness: Gavin Parker is a 50 y.o. male who presents today for a work in visit. Seen for Dr. Stanford Breed.  He has not been seen since July of 2015.   He has CAD - non obstructive per cath from 2012, prior testicular cancer, GERD, HLD and hypothyroidism.  He had an abnormal cardiac CTA 7/12 suggested significant coronary disease. Cardiac catheterization was performed in July of 2012 and revealed normal LM; normal Lcx; 20% mid LAD followed by a 40% lesion; 20 % distal LAD; 40 % proximal RCA; normal LV function. Echocardiogram June 2015 showed normal LV function. Nuclear study June 2015 showed mild inferior septal defect that was partially reversible but not felt significant for ischemia.  Comes in today. Here alone. He tells me that he had foot surgery back in November - very slow to recover. Got pretty sedentary. Started getting short of breath - ended up with pneumonia. Persistent breathing issues - treated for bronchitis. Having chest and arm pain in the middle of last week. This past Friday he started having chest pain radiating down the left shoulder/arm - he took NTG with prompt relief. Ended up back at [facility] ER - given NTG patch. Troponin was negative. CBC ok. Chemistries stable. He has had some improvement in his symptoms but they still return. Comes in here and having active left shoulder pain - responsive to NTG. BP has been running up - this is unusual for him. He continues to have shortness of breath - has had excessive sweats - mostly with exertion. Little nausea at night. No vomiting. Admits to "bad eating".   Past Medical History  Diagnosis Date  . 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 Last visit w/ urology June 2011 ([place]); last CT of  the abdomen -chest in [place]:  10/11  . GERD (gastroesophageal reflux disease)   . Hyperlipidemia   . Hypothyroidism   . CAD (coronary artery disease)     non obstructive per cath 09-2010  . Anginal pain [facility])     Past Surgical History  Procedure Laterality Date  . Orchiectomy      R  . Umbilical hernia repair    . Tonsillectomy    . Cardiac catheterization       Medications: Current Outpatient Prescriptions  Medication Sig Dispense Refill  . albuterol (VENTOLIN HFA) 108 (90 BASE) MCG/ACT inhaler Inhale 2 puffs into the lungs every 4 (four) hours as needed for wheezing or shortness of breath.     Marland Kitchen aspirin 81 MG tablet Take 81 mg by mouth daily.      . cetirizine (ZYRTEC) 10 MG tablet Take 1 tablet (10 mg total) by mouth daily. 30 tablet 6  . fluticasone (FLONASE) 50 MCG/ACT nasal spray 1 spray in each nostril twice a day as needed. Use the "crossover" technique as discussed    . lansoprazole (PREVACID) 30 MG capsule TAKE ONE CAPSULE BY MOUTH ONCE DAILY    . levothyroxine (SYNTHROID, LEVOTHROID) 112 MCG tablet Take 112 mcg by mouth daily before breakfast.    . montelukast (SINGULAIR) 10 MG  tablet Take 10 mg by mouth at bedtime.    . nitroGLYCERIN (NITROSTAT) 0.4 MG SL tablet Place 1 tablet (0.4 mg total) under the tongue every 5 (five) minutes as needed for chest pain (or arm pain). 30 tablet 12  . pantoprazole (PROTONIX) 40 MG tablet Take 40 mg by mouth daily.    . simvastatin (ZOCOR) 40 MG tablet Take 40 mg by mouth daily.     No current facility-administered medications for this visit.    Allergies: Allergies  Allergen Reactions  . Atorvastatin Itching  . Doxycycline Diarrhea    Nausea, diarrhea  . Gabapentin Itching  . Pravastatin Itching and Other (See Comments)    Body aching   . Rosuvastatin Other (See Comments)    Muscle pain  . Pantoprazole Nausea And  Vomiting    Social History: The patient  reports that he quit smoking about 3 years ago. He has never used smokeless tobacco. He reports that he drinks alcohol. He reports that he does not use illicit drugs.   Family History: The patient's family history includes Arthritis in his father; Cancer in his maternal grandfather; Diabetes in his mother; Ovarian cancer in his mother; Pancreatic cancer in his father.   Review of Systems: Please see the history of present illness.   Otherwise, the review of systems is positive for none.   All other systems are reviewed and negative.   Physical Exam: VS:  BP 148/90 mmHg  Pulse 70  Ht 5\' 11"  (1.803 m)  Wt 240 lb 1.9 oz (108.918 kg)  BMI 33.50 kg/m2 .  BMI Body mass index is 33.5 kg/(m^2).  Wt Readings from Last 3 Encounters:  05/01/15 240 lb 1.9 oz (108.918 kg)  09/10/14 240 lb (108.863 kg)  10/13/13 234 lb 1.6 oz (106.187 kg)    General: Pleasant. Well developed, well nourished and in no acute distress. He is complaining of active left shoulder pain. Little anxious.   HEENT: Normal. Neck: Supple, no JVD, carotid bruits, or masses noted.  Cardiac: Regular rate and rhythm. No murmurs, rubs, or gallops. No edema.  Respiratory:  Lungs are clear to auscultation bilaterally with normal work of breathing.  GI: Soft and nontender.  MS: No deformity or atrophy. Gait and ROM intact. Skin: Warm and dry. Color is normal.  Neuro:  Strength and sensation are intact and no gross focal deficits noted.  Psych: Alert, appropriate and with normal affect.   LABORATORY DATA:  EKG:  EKG is ordered today. This demonstrates NSR with no acute changes. Reviewed with Dr. Lovena Le (DOD).  Lab Results  Component Value Date   WBC 8.6 09/11/2014   HGB 15.1 09/11/2014   HCT 43.5 09/11/2014   PLT 228 09/11/2014   GLUCOSE 114* 09/11/2014   CHOL 121 09/21/2012   TRIG 143 09/21/2012   HDL 33* 09/21/2012   LDLCALC 59 09/21/2012   ALT 51 09/11/2014   AST 34  09/11/2014   NA 137 09/11/2014   K 4.0 09/11/2014   CL 103 09/11/2014   CREATININE 1.09 09/11/2014   BUN 13 09/11/2014   CO2 25 09/11/2014   TSH 1.88 07/14/2011   PSA 1.27 07/14/2011   INR 1.04 09/20/2012   HGBA1C 5.6 07/18/2011    BNP (last 3 results) No results for input(s): BNP in the last 8760 hours.  ProBNP (last 3 results) No results for input(s): PROBNP in the last 8760 hours.   Other Studies Reviewed Today:  Echo Study Conclusions from 08/2013  - Left ventricle:  The cavity size was normal. Systolic function was normal. The estimated ejection fraction was in the range of 60% to 65%. Wall motion was normal; there were no regional wall motion abnormalities.   Myoview Impression from 08/2013 Exercise Capacity: Excellent exercise capacity. BP Response: Normal blood pressure response. Clinical Symptoms: No significant symptoms noted. ECG Impression: No significant ST segment change suggestive of ischemia. Comparison with Prior Nuclear Study: No significant change from previous study  Overall Impression: Low risk stress nuclear study with mild inferoseptal defect that is partially reversible but not significant for ischemia.  LV Wall Motion: Inferoseptal hypokinesis - EF 48%.  Compared to the prior study in 2014, EF has dropped from 60 -> 48%, there are subtle, but not significant ischemic changes. He has known CAD in the LAD and RCA in 2012 by cath. Consideration for LHC should be given.  Pixie Casino, MD, [facility]   Assessment/Plan: 1. Unstable angina - discussed and subsequently seen with Dr. Lovena Le - will need to admit for further intervention. Needs cardiac catheterization - try to arrange for today - he has basically been NPO. The procedure has been discussed and he is willing to proceed. Transported by EMS to the [facility] ER. He was given NTG x 1 (his own supply) and 3 additional baby aspirins.   2. CAD - as above  3. Elevated BP   4.  HLD - on  Zocor  Current medicines are reviewed with the patient today.  The patient does not have concerns regarding medicines other than what has been noted above.  The following changes have been made:  See above.  Labs/ tests ordered today include:   No orders of the defined types were placed in this encounter.     Disposition:   Further disposition to follow.   Patient is agreeable to this plan and will call if any problems develop in the interim.   Signed: Burtis Junes, RN, ANP-C 05/01/2015 10:55 AM  [place] [address] [place], [place]  [address] Phone: [phone] Fax: [phone]

## 2015-05-02 ENCOUNTER — Encounter: Payer: Self-pay | Admitting: *Deleted

## 2015-05-02 ENCOUNTER — Encounter (HOSPITAL_COMMUNITY): Payer: Self-pay | Admitting: Interventional Cardiology

## 2015-05-02 ENCOUNTER — Telehealth: Payer: Self-pay | Admitting: Cardiology

## 2015-05-02 LAB — HEMOGLOBIN A1C
Hgb A1c MFr Bld: 5.4 % (ref 4.8–5.6)
Mean Plasma Glucose: 108 mg/dL

## 2015-05-02 NOTE — Telephone Encounter (Signed)
Ok for note to return to work [employer]

## 2015-05-02 NOTE — Telephone Encounter (Signed)
Returned call to pt. He had cath yesterday.  He needs a note for work to return to full duty/stating clearance cardiac-wise. Would like this for a return to work date of Monday, 2/6. Pt states he can pick letter up at our office this week.  Pt aware this is being sent to Dr. Stanford Breed to review.

## 2015-05-02 NOTE — Telephone Encounter (Signed)
Spoke with pt, Aware of dr Jacalyn Lefevre recommendations.  He would like to pick up the letter Thursday or Friday. Aware will place at the front desk for pick up.

## 2015-05-02 NOTE — Telephone Encounter (Signed)
NEw Message  Pt requested to speak w/ RN concerning a 'return to work' letter. Please call back and discuss.

## 2015-05-04 ENCOUNTER — Telehealth: Payer: Self-pay | Admitting: Cardiology

## 2015-05-04 NOTE — Telephone Encounter (Signed)
Pt having some puffiness around the site for radial entry of cath. Advised likely in normal stage of repair - pt denies any redness, pain, other problems. He states he would feel better to come by and have someone take a look at the site. Advised this would be fine, pt can ask for me. He will come by office today.

## 2015-05-04 NOTE — Telephone Encounter (Signed)
Saw pt - site looks like in normal stages of healing, some slight bruising & puffiness but o/w OK. Soreness but no other pain. Pt encouraged to monitor and alert if new symptoms i.e burning, increased swelling, redness at site.

## 2015-05-04 NOTE — Telephone Encounter (Signed)
New message     Pt had a cath 3 days ago.  The area at his wrist looks puffy and funny.  Please call

## 2015-05-16 ENCOUNTER — Telehealth: Payer: Self-pay | Admitting: Cardiology

## 2015-05-16 NOTE — Telephone Encounter (Signed)
Spoke with pt, he has a small knot in the right wrist that is painful to the touch. Reassurance given that this can occur. Patient voiced understanding to call if the area changes or he becomes concerned.

## 2015-05-16 NOTE — Telephone Encounter (Signed)
New message      Pt had a cath 1-2weeks ago.  The area on his wrist is puffy and sometimes painful.  Is this normal?

## 2015-05-25 DIAGNOSIS — J342 Deviated nasal septum: Secondary | ICD-10-CM | POA: Insufficient documentation

## 2015-05-25 DIAGNOSIS — R51 Headache: Secondary | ICD-10-CM

## 2015-05-25 DIAGNOSIS — K1379 Other lesions of oral mucosa: Secondary | ICD-10-CM | POA: Insufficient documentation

## 2015-05-25 DIAGNOSIS — M26629 Arthralgia of temporomandibular joint, unspecified side: Secondary | ICD-10-CM | POA: Insufficient documentation

## 2015-05-25 DIAGNOSIS — R0981 Nasal congestion: Secondary | ICD-10-CM | POA: Insufficient documentation

## 2015-05-25 DIAGNOSIS — R519 Headache, unspecified: Secondary | ICD-10-CM | POA: Insufficient documentation

## 2015-05-25 DIAGNOSIS — M270 Developmental disorders of jaws: Secondary | ICD-10-CM | POA: Insufficient documentation

## 2015-05-25 DIAGNOSIS — E041 Nontoxic single thyroid nodule: Secondary | ICD-10-CM | POA: Insufficient documentation

## 2015-05-30 DIAGNOSIS — G4733 Obstructive sleep apnea (adult) (pediatric): Secondary | ICD-10-CM | POA: Insufficient documentation

## 2015-05-30 DIAGNOSIS — J343 Hypertrophy of nasal turbinates: Secondary | ICD-10-CM | POA: Insufficient documentation

## 2015-06-29 ENCOUNTER — Ambulatory Visit (INDEPENDENT_AMBULATORY_CARE_PROVIDER_SITE_OTHER): Payer: 59 | Admitting: Nurse Practitioner

## 2015-06-29 ENCOUNTER — Encounter: Payer: Self-pay | Admitting: Nurse Practitioner

## 2015-06-29 VITALS — BP 128/88 | HR 64 | Ht 71.0 in | Wt 244.0 lb

## 2015-06-29 DIAGNOSIS — E785 Hyperlipidemia, unspecified: Secondary | ICD-10-CM | POA: Diagnosis not present

## 2015-06-29 DIAGNOSIS — I2510 Atherosclerotic heart disease of native coronary artery without angina pectoris: Secondary | ICD-10-CM | POA: Diagnosis not present

## 2015-06-29 DIAGNOSIS — I209 Angina pectoris, unspecified: Secondary | ICD-10-CM | POA: Diagnosis not present

## 2015-06-29 NOTE — Patient Instructions (Addendum)
We will be checking the following labs today - NONE   Medication Instructions:    Continue with your current medicines.     Testing/Procedures To Be Arranged:  N/A  Follow-Up:   See Dr. Stanford Breed in 6 months.     Other Special Instructions:   Exercise/diet/weight loss recommended.     If you need a refill on your cardiac medications before your next appointment, please call your pharmacy.   Call the [place] office at [phone] if you have any questions, problems or concerns.

## 2015-06-29 NOTE — Progress Notes (Signed)
CARDIOLOGY OFFICE NOTE  Date:  06/29/2015    Gavin Parker Date of Birth: 01-30-1966 Medical Record [id number]  PCP:  Luetta Nutting, DO  Cardiologist:  [facility]    Chief Complaint  Patient presents with  . Coronary Artery Disease  . Hypertension  . Hyperlipidemia    Follow up visit - seen for Dr. Stanford Breed.     History of Present Illness: Gavin Parker is a 50 y.o. male who presents today for a follow up visit. Seen for Dr. Stanford Breed.  He had not been seen since July of 2015 until earlier this year.   He has CAD - non obstructive per cath from 2012, prior testicular cancer, GERD, HLD and hypothyroidism. He had an abnormal cardiac CTA 7/12 suggested significant coronary disease. Cardiac catheterization was performed in July of 2012 and revealed normal LM; normal Lcx; 20% mid LAD followed by a 40% lesion; 20 % distal LAD; 40 % proximal RCA; normal LV function. Echocardiogram June 2015 showed normal LV function. Nuclear study June 2015 showed mild inferior septal defect that was partially reversible but not felt significant for ischemia.  I saw him back in January - was having active chest pain - ended up sending for repeat cath - see below - to continue with medical management.   Comes in today. Here alone. He says he is doing well. No chest pain. Still has some issues with his breathing - saw his PCP - started on Prednisone. Has had lung nodules on prior CT scan - planning to have repeated at the end of the year. Worried about his cath site - feels like there is a small knot there. Labs are checked by PCP.   Past Medical History  Diagnosis Date  . 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 Last visit w/ urology June 2011 ([place]); last CT of  the abdomen -chest in [place]:  10/11  . GERD (gastroesophageal reflux disease)   . Hyperlipidemia   . Hypothyroidism   . CAD (coronary artery disease)     non obstructive per cath 09-2010  . Anginal pain  ([place])   . High cholesterol     Past Surgical History  Procedure Laterality Date  . Orchiectomy      R  . Umbilical hernia repair    . Tonsillectomy    . Cardiac catheterization    . Cardiac catheterization N/A 05/01/2015    Procedure: Left Heart Cath and Coronary Angiography;  Surgeon: Belva Crome, MD;  Location: [place] [facility];  Service: Cardiovascular;  Laterality: N/A;     Medications: Current Outpatient Prescriptions  Medication Sig Dispense Refill  . albuterol (VENTOLIN HFA) 108 (90 BASE) MCG/ACT inhaler Inhale 2 puffs into the lungs every 4 (four) hours as needed for wheezing or shortness of breath.     Marland Kitchen aspirin 81 MG tablet Take 81 mg by mouth daily.      . cetirizine (ZYRTEC) 10 MG tablet Take 1 tablet (10 mg total) by mouth daily. 30 tablet 6  . lansoprazole (PREVACID) 30 MG capsule TAKE ONE CAPSULE BY MOUTH ONCE DAILY    . levothyroxine (SYNTHROID, LEVOTHROID) 112 MCG tablet Take 112 mcg by mouth daily before breakfast.    . montelukast (SINGULAIR) 10 MG tablet Take 10 mg by mouth at bedtime.    . nitroGLYCERIN (NITROSTAT) 0.4 MG SL tablet Place 1 tablet (0.4 mg total) under the tongue every 5 (five) minutes as needed for chest  pain (or arm pain). 30 tablet 12  . predniSONE (DELTASONE) 20 MG tablet Take 40 mg by mouth daily. 7 days    . simvastatin (ZOCOR) 40 MG tablet Take 40 mg by mouth daily.    . fluticasone (FLONASE) 50 MCG/ACT nasal spray Reported on 06/29/2015     No current facility-administered medications for this visit.    Allergies: Allergies  Allergen Reactions  . Atorvastatin Itching  . Doxycycline Diarrhea    Nausea, diarrhea  . Gabapentin Itching  . Pravastatin Itching and Other (See Comments)    Body aching   . Rosuvastatin Other (See Comments)    Muscle pain  . Pantoprazole Nausea And Vomiting    Social History: The patient  reports that he quit smoking about 3 years ago. He has never used smokeless tobacco. He reports that he drinks  alcohol. He reports that he does not use illicit drugs.   Family History: The patient's family history includes Arthritis in his father; Cancer in his maternal grandfather; Diabetes in his mother; Ovarian cancer in his mother; Pancreatic cancer in his father.   Review of Systems: Please see the history of present illness.   Otherwise, the review of systems is positive for none.   All other systems are reviewed and negative.   Physical Exam: VS:  BP 128/88 mmHg  Pulse 64  Ht 5\' 11"  (1.803 m)  Wt 244 lb (110.678 kg)  BMI 34.05 kg/m2 .  BMI Body mass index is 34.05 kg/(m^2).  Wt Readings from Last 3 Encounters:  06/29/15 244 lb (110.678 kg)  05/01/15 240 lb (108.863 kg)  05/01/15 240 lb 1.9 oz (108.918 kg)    General: Pleasant. Well developed, well nourished and in no acute distress. He has gained weight.  HEENT: Normal. Neck: Supple, no JVD, carotid bruits, or masses noted.  Cardiac: Regular rate and rhythm. No murmurs, rubs, or gallops. No edema.  Respiratory:  Lungs are clear to auscultation bilaterally with normal work of breathing.  GI: Soft and nontender.  MS: No deformity or atrophy. Gait and ROM intact. Skin: Warm and dry. Color is normal.  Neuro:  Strength and sensation are intact and no gross focal deficits noted.  Psych: Alert, appropriate and with normal affect. His right wrist looks fine. Good 2+ pulse. There may be a very tiny soft nodule - less than the size of a pencil eraser. Right hand with good color, motion, and sensation.    LABORATORY DATA:  EKG:  EKG is not ordered today.  Lab Results  Component Value Date   WBC 6.4 05/01/2015   HGB 14.7 05/01/2015   HCT 41.7 05/01/2015   PLT 207 05/01/2015   GLUCOSE 93 05/01/2015   CHOL 121 09/21/2012   TRIG 143 09/21/2012   HDL 33* 09/21/2012   LDLCALC 59 09/21/2012   ALT 49 05/01/2015   AST 30 05/01/2015   NA 141 05/01/2015   K 4.2 05/01/2015   CL 107 05/01/2015   CREATININE 0.90 05/01/2015   BUN 11  05/01/2015   CO2 25 05/01/2015   TSH 2.150 05/01/2015   PSA 1.27 07/14/2011   INR 0.99 05/01/2015   HGBA1C 5.4 05/01/2015    BNP (last 3 results) No results for input(s): BNP in the last 8760 hours.  ProBNP (last 3 results) No results for input(s): PROBNP in the last 8760 hours.   Other Studies Reviewed Today:  Procedures 05/01/2015   Left Heart Cath and Coronary Angiography    Conclusion  Prox RCA lesion, 30% stenosed.  Mid LAD lesion, 40% stenosed.  Prox Cx to Dist Cx lesion, 20% stenosed.   Widely patent coronary arteries with minimal atherosclerosis but no lesion greater than 40%.  Normal left ventricular function  No change in anatomy we compared to prior angiogram   Recommendations:   Consider other diagnoses to explain the patient's symptoms.    Echo Study Conclusions from 08/2013  - Left ventricle: The cavity size was normal. Systolic function was normal. The estimated ejection fraction was in the range of 60% to 65%. Wall motion was normal; there were no regional wall motion abnormalities.  Thyroid US IMPRESSION 09/2014: Heterogeneous gland. Solitary right lower pole nodule measures 11 mm. No abnormal adenopathy. Findings do not meet current SRU consensus criteria for biopsy. Follow-up by clinical exam is recommended. If patient has known risk factors for thyroid carcinoma, consider follow-up ultrasound in 12 months. If patient is clinically hyperthyroid, consider nuclear medicine thyroid uptake and scan.Reference: Management of Thyroid Nodules Detected at [place]: Society of Radiologists in [place]. Radiology 2005; [id number].   Electronically Signed  By: Marybelle Killings M.D.   Assessment/Plan: 1. CAD - with prior cardiac cath earlier this year - to manage medically - discussed the need for CV risk factor modification. Cath site is felt to be ok.   3. HTN - BP ok on current regimen  4. HLD - on  Zocor - labs are checked by PCP  5. Dyspnea - currently on prednisone by PCP - may need pulmonary referral if does not respond.   Current medicines are reviewed with the patient today.  The patient does not have concerns regarding medicines other than what has been noted above.  The following changes have been made:  See above.  Labs/ tests ordered today include:   No orders of the defined types were placed in this encounter.     Disposition:   FU with Dr. Stanford Breed in 6 months.    Patient is agreeable to this plan and will call if any problems develop in the interim.   Signed: Burtis Junes, RN, ANP-C 06/29/2015 9:55 AM  [facility] [address] [place], [place]  [address] Phone: [phone] Fax: [phone]

## 2016-01-13 ENCOUNTER — Encounter (HOSPITAL_COMMUNITY): Payer: Self-pay

## 2016-01-13 ENCOUNTER — Emergency Department (HOSPITAL_COMMUNITY): Payer: 59

## 2016-01-13 ENCOUNTER — Emergency Department (HOSPITAL_COMMUNITY)
Admission: EM | Admit: 2016-01-13 | Discharge: 2016-01-13 | Disposition: A | Discharge status: Home / Self Care | Payer: 59 | Attending: Emergency Medicine | Admitting: Emergency Medicine

## 2016-01-13 DIAGNOSIS — R202 Paresthesia of skin: Secondary | ICD-10-CM

## 2016-01-13 DIAGNOSIS — I2510 Atherosclerotic heart disease of native coronary artery without angina pectoris: Secondary | ICD-10-CM | POA: Diagnosis not present

## 2016-01-13 DIAGNOSIS — R0789 Other chest pain: Secondary | ICD-10-CM | POA: Insufficient documentation

## 2016-01-13 DIAGNOSIS — I10 Essential (primary) hypertension: Secondary | ICD-10-CM | POA: Insufficient documentation

## 2016-01-13 DIAGNOSIS — Z7982 Long term (current) use of aspirin: Secondary | ICD-10-CM | POA: Diagnosis not present

## 2016-01-13 DIAGNOSIS — Z8547 Personal history of malignant neoplasm of testis: Secondary | ICD-10-CM | POA: Insufficient documentation

## 2016-01-13 DIAGNOSIS — E039 Hypothyroidism, unspecified: Secondary | ICD-10-CM | POA: Diagnosis not present

## 2016-01-13 DIAGNOSIS — Z87891 Personal history of nicotine dependence: Secondary | ICD-10-CM | POA: Diagnosis not present

## 2016-01-13 DIAGNOSIS — R079 Chest pain, unspecified: Secondary | ICD-10-CM | POA: Diagnosis present

## 2016-01-13 LAB — BASIC METABOLIC PANEL
ANION GAP: 9 (ref 5–15)
BUN: 13 mg/dL (ref 6–20)
CHLORIDE: 105 mmol/L (ref 101–111)
CO2: 25 mmol/L (ref 22–32)
Calcium: 9.9 mg/dL (ref 8.9–10.3)
Creatinine, Ser: 0.92 mg/dL (ref 0.61–1.24)
GFR calc Af Amer: >60 mL/min (ref >60)
GFR calc non Af Amer: >60 mL/min (ref >60)
GLUCOSE: 96 mg/dL (ref 65–99)
POTASSIUM: 4 mmol/L (ref 3.5–5.1)
SODIUM: 139 mmol/L (ref 135–145)

## 2016-01-13 LAB — CBC
HEMATOCRIT: 42.6 % (ref 39.0–52.0)
HEMOGLOBIN: 14.9 g/dL (ref 13.0–17.0)
MCH: 29.3 pg (ref 26.0–34.0)
MCHC: 35 g/dL (ref 30.0–36.0)
MCV: 83.9 fL (ref 78.0–100.0)
Platelets: 190 10*3/uL (ref 150–400)
RBC: 5.08 MIL/uL (ref 4.22–5.81)
RDW: 12.7 % (ref 11.5–15.5)
WBC: 7 10*3/uL (ref 4.0–10.5)

## 2016-01-13 LAB — I-STAT TROPONIN, ED
TROPONIN I, POC: 0 ng/mL (ref 0.00–0.08)
Troponin i, poc: 0 ng/mL (ref 0.00–0.08)

## 2016-01-13 MED ORDER — PREDNISONE 20 MG PO TABS: ORAL_TABLET | ORAL | 0 refills | Status: DC

## 2016-01-13 MED ORDER — DIPHENHYDRAMINE HCL 25 MG PO CAPS
25.0000 mg | ORAL_CAPSULE | Freq: Once | ORAL | Status: AC
  Administered 2016-01-13: 25 mg via ORAL
  Filled 2016-01-13: qty 1

## 2016-01-13 NOTE — ED Triage Notes (Signed)
Pt states took nitro yesterday for pain. Pt states nitro helped with pain.

## 2016-01-13 NOTE — ED Provider Notes (Signed)
[facility] DEPT Provider Note   CSN: [id number] Arrival date & time: 01/13/16  [id number]     History   Chief Complaint Chief Complaint  Patient presents with  . Chest Pain  . Arm Pain    HPI Gavin Parker is a 50 y.o. male hx of CAD, GERD, HL, here with chest pain, left arm and leg paresthesias. Patient had onset of chest pain yesterday. He states that his left-sided does radiate to his left arm. He took several nitros and it improved. Patient actually was admitted to the hospital in January this year and had a cath that showed some disease but did not have a stent at that time. He does follow-up with cardiology. Patient also states that for several months, he has been having left leg numbness and paresthesias and itchiness. Since yesterday he's been having some left arm numbness. Denies any weakness denies any trouble walking or trouble urinating.     The history is provided by the patient.    Past Medical History:  Diagnosis Date  . Anginal pain ([place])   . CAD (coronary artery disease)    non obstructive per cath 09-2010  . GERD (gastroesophageal reflux disease)   . High cholesterol   . Hyperlipidemia   . Hypothyroidism   . 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 Last visit w/ urology June 2011 [facility]); last CT of  the abdomen -chest in [place]:  10/11    Patient Active Problem List   Diagnosis Date Noted  . HTN (hypertension) 05/01/2015  . Pain in the chest   . Chest pain with exertion 09/20/2012  . Breast mass in male 02/13/2012  . Foot pain 02/13/2012  . Groin pain 09/29/2011  . Eustachian tube dysfunction 09/29/2011  . Baker's cyst of knee 06/20/2011  . Rectal bleeding 06/16/2011  . Baker's cyst 06/16/2011  . Cough 01/07/2011  . Low back pain with sciatica 01/07/2011  . Hypopigmentation 11/14/2010  . CAD in native artery 10/25/2010  . Acute sinusitis 10/14/2010  . Skin lesion 10/14/2010  . Allergic rhinitis 10/01/2010  . Diarrhea  07/30/2010  . Inguinal pain 07/23/2010  . RHINITIS 05/27/2010  . TESTICULAR CANCER 03/07/2010  . EXTERNAL HEMORRHOIDS, THROMBOSED 03/07/2010  . GERD 01/23/2010  . HYPOTHYROIDISM 01/09/2010  . HLD (hyperlipidemia) 01/09/2010  . DYSPNEA ON EXERTION 01/09/2010    Past Surgical History:  Procedure Laterality Date  . CARDIAC CATHETERIZATION    . CARDIAC CATHETERIZATION N/A 05/01/2015   Procedure: Left Heart Cath and Coronary Angiography;  Surgeon: Belva Crome, MD;  Location: [place] [facility] LAB;  Service: Cardiovascular;  Laterality: N/A;  . ORCHIECTOMY     R  . TONSILLECTOMY    . UMBILICAL HERNIA REPAIR         Home Medications    Prior to Admission medications   Medication Sig Start Date End Date Taking? Authorizing Provider  albuterol (VENTOLIN HFA) 108 (90 BASE) MCG/ACT inhaler Inhale 2 puffs into the lungs every 4 (four) hours as needed for wheezing or shortness of breath.    Yes Historical Provider, MD  aspirin 81 MG tablet Take 81 mg by mouth daily.     Yes Historical Provider, MD  cetirizine (ZYRTEC) 10 MG tablet Take 1 tablet (10 mg total) by mouth daily. 07/23/10  Yes Colon Branch, MD  fluticasone [facility]) 50 MCG/ACT nasal spray Place 2 sprays into both nostrils daily. Reported on 06/29/2015 11/22/10  Yes Hendricks Limes, MD  hydrOXYzine (  VISTARIL) 50 MG capsule Take 50 mg by mouth 3 (three) times daily as needed for itching. 01/05/16 01/15/16 Yes Historical Provider, MD  lansoprazole (PREVACID) 30 MG capsule Take 30 mg by mouth every morning 04/03/15  Yes Historical Provider, MD  levothyroxine (SYNTHROID, LEVOTHROID) 112 MCG tablet Take 112 mcg by mouth daily before breakfast.   Yes Historical Provider, MD  montelukast (SINGULAIR) 10 MG tablet Take 10 mg by mouth at bedtime.   Yes Historical Provider, MD  nitroGLYCERIN (NITROSTAT) 0.4 MG SL tablet Place 1 tablet (0.4 mg total) under the tongue every 5 (five) minutes as needed for chest pain (or arm pain). 09/21/12  Yes Ripudeep Krystal Eaton, MD  simvastatin (ZOCOR) 40 MG tablet Take 40 mg by mouth daily.   Yes Historical Provider, MD  triamcinolone cream (KENALOG) 0.1 % Apply 1 application topically 2 (two) times daily. For itching 01/05/16 01/04/17 Yes Historical Provider, MD  predniSONE (DELTASONE) 20 MG tablet Take 60 mg daily x 2 days then 40 mg daily x 2 days then 20 mg daily x 2 days 01/13/16   Drenda Freeze, MD    Family History Family History  Problem Relation Age of Onset  . Arthritis Father   . Pancreatic cancer Father   . Diabetes Mother   . Ovarian cancer Mother   . Cancer Maternal Grandfather     metastatic     Social History Social History  Substance Use Topics  . Smoking status: Former Smoker    Quit date: 03/31/2012  . Smokeless tobacco: Never Used  . Alcohol use Yes     Comment: 2 per week     Allergies   Azithromycin; Atorvastatin; Doxycycline; Gabapentin; Pravastatin; Rosuvastatin; Amoxicillin; Pantoprazole; and Penicillin g   Review of Systems Review of Systems  Cardiovascular: Positive for chest pain.  Neurological: Positive for numbness.  All other systems reviewed and are negative.    Physical Exam Updated Vital Signs BP 124/80   Pulse (!) 53   Temp 98 F (36.7 C) (Oral)   Resp 21   SpO2 99%   Physical Exam  Constitutional: He is oriented to person, place, and time. He appears well-developed and well-nourished.  HENT:  Head: Normocephalic.  Mouth/Throat: Oropharynx is clear and moist.  Eyes: Conjunctivae and EOM are normal. Pupils are equal, round, and reactive to light.  Neck: Normal range of motion. Neck supple.  Cardiovascular: Normal rate, regular rhythm and normal heart sounds.   Pulmonary/Chest: Effort normal and breath sounds normal. No respiratory distress. He has no wheezes. He has no rales.  Abdominal: Soft. Bowel sounds are normal. He exhibits no distension. There is no tenderness. There is no guarding.  Musculoskeletal: Normal range of motion.    Neurological: He is alert and oriented to person, place, and time. Abnormal muscle tone: .id.  Slightly dec sensation L forearm. Dec sensation lateral aspect L lateral aspect of the thigh. Nl strength throughout. No saddle anesthesia.   Skin: Skin is warm.  Psychiatric: He has a normal mood and affect.  Nursing note and vitals reviewed.    ED Treatments / Results  Labs (all labs ordered are listed, but only abnormal results are displayed) Labs Reviewed  BASIC METABOLIC PANEL  CBC  I-STAT [place], ED  Randolm Idol, ED    EKG  EKG Interpretation  Date/Time:  Sunday January 13 2016 16:39:39 EDT Ventricular Rate:  66 PR Interval:  160 QRS Duration: 92 QT Interval:  404 QTC Calculation: 423 R Axis:  55 Text Interpretation:  Normal sinus rhythm Normal ECG No significant change since last tracing Confirmed by YAO  MD, DAVID ([id number]) on 01/13/2016 11:55:32 PM       Radiology Dg Chest 2 View  Result Date: 01/13/2016 CLINICAL DATA:  Shortness of breath and cough. EXAM: CHEST  2 VIEW COMPARISON:  05/01/2015 and prior exams FINDINGS: The cardiomediastinal silhouette is unremarkable. There is no evidence of focal airspace disease, pulmonary edema, suspicious pulmonary nodule/mass, pleural effusion, or pneumothorax. No acute bony abnormalities are identified. IMPRESSION: No active cardiopulmonary disease. Electronically Signed   By: Margarette Canada M.D.   On: 01/13/2016 17:57    Procedures Procedures (including critical care time)  Medications Ordered in ED Medications  diphenhydrAMINE (BENADRYL) capsule 25 mg (25 mg Oral Given 01/13/16 2156)     Initial Impression / Assessment and Plan / ED Course  I have reviewed the triage vital signs and the nursing notes.  Pertinent labs & imaging results that were available during my care of the patient were reviewed by me and considered in my medical decision making (see chart for details).  Clinical Course    Nouman Malagon is  a 50 y.o. male here with chest pain, paresthesia. Chest pain since yesterday, resolved now. No EKG changes. Will get trop x 2. L leg paresthesia chronic and neurovascular intact, L arm paresthesia since yesterday. I think likely cervical/lumbar radiculopathy. Strength intact, will not need MRI and I doubt dissection.   11 pm Pain free. Delta trop neg. CXR nl. He has some cough and felt itchy where he has paresthesias but has no visible rash. Will give steroids to help radiculopathy/itchiness. Benadryl as needed. Recommend follow up with cardiology.      Final Clinical Impressions(s) / ED Diagnoses   Final diagnoses:  Other chest pain  Paresthesia of left arm  Paresthesia of left lower extremity    New Prescriptions Discharge Medication List as of 01/13/2016 11:10 PM    START taking these medications   Details  predniSONE (DELTASONE) 20 MG tablet Take 60 mg daily x 2 days then 40 mg daily x 2 days then 20 mg daily x 2 days, Print         Drenda Freeze, MD 01/13/16 2358

## 2016-01-13 NOTE — ED Triage Notes (Signed)
Pt complining of sharp left chest pain and L arm numbness. Pt state pain radiates to back and L arm. Pt states hx of cardiac caths. Pt states pain is different. Pt state recent L leg/calf pain. Pt states some exertional dyspnea. Pt 98% on RA.

## 2016-01-13 NOTE — ED Notes (Signed)
Delay in lab draw,  edp examining pt. 

## 2016-01-13 NOTE — Discharge Instructions (Signed)
Take prednisone as prescribed.   Take motrin for pain.   See your cardiologist and primary care doctor  Return to ER if you have worse numbness, weakness, chest pain, shortness of breath.

## 2016-01-21 ENCOUNTER — Encounter: Payer: Self-pay | Admitting: Physician Assistant

## 2016-01-21 ENCOUNTER — Ambulatory Visit (INDEPENDENT_AMBULATORY_CARE_PROVIDER_SITE_OTHER): Payer: 59 | Admitting: Physician Assistant

## 2016-01-21 VITALS — BP 110/80 | HR 66 | Ht 71.0 in | Wt 238.8 lb

## 2016-01-21 DIAGNOSIS — R072 Precordial pain: Secondary | ICD-10-CM

## 2016-01-21 NOTE — Progress Notes (Signed)
Cardiology Office Note   Date:  01/21/2016   ID:  Gavin Parker, DOB 1965/09/09, MRN [id number]  PCP:  Luetta Nutting, DO  Cardiologist:  Dr Alcide Evener, PA-C   Chief Complaint  Patient presents with  . Follow-up    hospital    History of Present Illness: Gavin Parker is a 49 y.o. male with a history of non-obs CAD 04/2015 cath, GERD, HLD, hypothyroid, HTN  Seen in ER for CP 10/15>>cards f/u requested  Gavin Parker presents for evaluation of his chest pain.   In general, he has been doing very well. However, he recently developed burning and itching in his left outer thigh. He has had similar discomfort in this area before. At one point, he was told it was his sciatic nerve which is known to cause problems for him. He has also been having problems with his right hand. He states he will drop things and doesn't know why. He has some testing scheduled for this.  The ER visit was prompted by an episode of upper left chest and shoulder pain. He got a sharp pain that came down towards the middle of his chest. He was also having some left arm discomfort and numbness. He took a sublingual nitroglycerin and it helped some. Labs including multiple troponins and EKG were checked at the ER and were okay. The final diagnosis was cervical/lumbar radiculopathy. He was given steroids to help with this and Benadryl when necessary.  He has had no more episodes of the chest pain. The left arm numbness is a little bit better. He is being very careful when he uses his right hand.   Past Medical History:  Diagnosis Date  . Anginal pain ([place])   . CAD (coronary artery disease)    non obstructive per cath 09-2010  . GERD (gastroesophageal reflux disease)   . High cholesterol   . Hyperlipidemia   . Hypothyroidism   . 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 Last visit w/ urology June 2011 ([place]); last CT of  the abdomen -chest in [place]:  10/11     Past Surgical History:  Procedure Laterality Date  . CARDIAC CATHETERIZATION  2012   non-obs dz  . CARDIAC CATHETERIZATION N/A 05/01/2015   Procedure: Left Heart Cath and Coronary Angiography;  Surgeon: Belva Crome, MD;  Location: [facility];  Service: Cardiovascular;  Laterality: N/A;  . ORCHIECTOMY     R  . TONSILLECTOMY    . UMBILICAL HERNIA REPAIR      Current Outpatient Prescriptions  Medication Sig Dispense Refill  . albuterol (VENTOLIN HFA) 108 (90 BASE) MCG/ACT inhaler Inhale 2 puffs into the lungs every 4 (four) hours as needed for wheezing or shortness of breath.     Marland Kitchen aspirin 81 MG tablet Take 81 mg by mouth daily.      . cetirizine (ZYRTEC) 10 MG tablet Take 1 tablet (10 mg total) by mouth daily. 30 tablet 6  . fluticasone (FLONASE) 50 MCG/ACT nasal spray Place 2 sprays into both nostrils daily. Reported on 06/29/2015    . lansoprazole (PREVACID) 30 MG capsule Take 30 mg by mouth every morning    . levothyroxine (SYNTHROID, LEVOTHROID) 112 MCG tablet Take 112 mcg by mouth daily before breakfast.    . montelukast (SINGULAIR) 10 MG tablet Take 10 mg by mouth at bedtime.    . nitroGLYCERIN (NITROSTAT) 0.4 MG SL tablet Place 1 tablet (0.4 mg total)  under the tongue every 5 (five) minutes as needed for chest pain (or arm pain). 30 tablet 12  . simvastatin (ZOCOR) 40 MG tablet Take 40 mg by mouth daily.    Marland Kitchen triamcinolone cream (KENALOG) 0.1 % Apply 1 application topically 2 (two) times daily. For itching     No current facility-administered medications for this visit.     Allergies:   Doxycycline; Pravastatin; Rosuvastatin; Amoxicillin; Pantoprazole; and Penicillin g    Social History:  The patient  reports that he quit smoking about 3 years ago. He has never used smokeless tobacco. He reports that he drinks alcohol. He reports that he does not use drugs.   Family History:  The patient's family history includes Arthritis in his father; Cancer in his maternal  grandfather; Diabetes in his mother; Ovarian cancer in his mother; Pancreatic cancer in his father.    ROS:  Please see the history of present illness. All other systems are reviewed and negative.    PHYSICAL EXAM: VS:  BP 110/80   Pulse 66   Ht 5\' 11"  (1.803 m)   Wt 238 lb 12.8 oz (108.3 kg)   SpO2 94%   BMI 33.31 kg/m  , BMI Body mass index is 33.31 kg/m. GEN: Well nourished, well developed, male in no acute distress  HEENT: normal for age  Neck: no JVD, no carotid bruit, no masses Cardiac: RRR; no murmur, no rubs, or gallops Respiratory:  clear to auscultation bilaterally, normal work of breathing GI: soft, nontender, nondistended, + BS MS: no deformity or atrophy; no edema; distal pulses are 2+ in all 4 extremities   Skin: warm and dry, no rash Neuro:  Strength and sensation are intact Psych: euthymic mood, full affect   EKG:  EKG is not ordered today.  CATH: 04/2015  Prox RCA lesion, 30% stenosed.  Mid LAD lesion, 40% stenosed.  Prox Cx to Dist Cx lesion, 20% stenosed.  Widely patent coronary arteries with minimal atherosclerosis but no lesion greater than 40%.  Normal left ventricular function  No change in anatomy we compared to prior angiogram Recommendations:  Consider other diagnoses to explain the patient's symptoms.     Recent Labs: 05/01/2015: ALT 49; Magnesium 2.1; TSH 2.150 01/13/2016: BUN 13; Creatinine, Ser 0.92; Hemoglobin 14.9; Platelets 190; Potassium 4.0; Sodium 139    Lipid Panel    Component Value Date/Time   CHOL 121 09/21/2012 0745   TRIG 143 09/21/2012 0745   HDL 33 (L) 09/21/2012 0745   CHOLHDL 3.7 09/21/2012 0745   VLDL 29 09/21/2012 0745   LDLCALC 59 09/21/2012 0745     Wt Readings from Last 3 Encounters:  01/21/16 238 lb 12.8 oz (108.3 kg)  06/29/15 244 lb (110.7 kg)  05/01/15 240 lb (108.9 kg)     Other studies Reviewed: Additional studies/ records that were reviewed today include: Hospital records and  testing.  ASSESSMENT AND PLAN:  1.  Chest pain: His symptoms are atypical for cardiac pain. He is following up with testing to determine if there is a nerve component pain. His symptoms improved on the steroids and Benadryl described by the ER. Continue current therapy.  2. Nonobstructive coronary artery disease by cath 04/2015: His lesions had not progressed in the preceding 5 years. He quit smoking years ago. He is on Zocor 40 mg for hyperlipidemia and is compliant with this. He recently made some dietary changes and is encouraged to maintain a heart healthy diet, normal weight. Continue lifestyle modifications and follow-up in  a year.  Current medicines are reviewed at length with the patient today.  The patient does not have concerns regarding medicines.  The following changes have been made:  no change  Labs/ tests ordered today include:  No orders of the defined types were placed in this encounter.    Disposition:   FU with Dr. Stanford Breed  Signed, Rosaria Ferries, PA-C  01/21/2016 9:15 AM    [place] Phone: [phone]; Fax: [phone]  This note was written with the assistance of speech recognition software. Please excuse any transcriptional errors.

## 2016-01-21 NOTE — Patient Instructions (Signed)
Medication Instructions:   Your physician recommends that you continue on your current medications as directed. Please refer to the Current Medication list given to you today.     If you need a refill on your cardiac medications before your next appointment, please call your pharmacy.  Labwork: NONE ORDER TODAY    Testing/Procedures: NONE ORDER TODAY    Follow-Up:  Your physician wants you to follow-up in: ONE YEAR WITH [facility]  You will receive a reminder letter in the mail two months in advance. If you don't receive a letter, please call our office to schedule the follow-up appointment.     Any Other Special Instructions Will Be Listed Below (If Applicable).                                                                                                                                                   

## 2016-08-03 DIAGNOSIS — Z1589 Genetic susceptibility to other disease: Secondary | ICD-10-CM | POA: Insufficient documentation

## 2016-08-07 DIAGNOSIS — S83242A Other tear of medial meniscus, current injury, left knee, initial encounter: Secondary | ICD-10-CM | POA: Insufficient documentation

## 2016-08-22 DIAGNOSIS — D447 Neoplasm of uncertain behavior of aortic body and other paraganglia: Secondary | ICD-10-CM | POA: Insufficient documentation

## 2016-10-05 ENCOUNTER — Encounter (HOSPITAL_COMMUNITY): Payer: Self-pay | Admitting: Emergency Medicine

## 2016-10-05 ENCOUNTER — Telehealth: Payer: Self-pay | Admitting: Internal Medicine

## 2016-10-05 DIAGNOSIS — E039 Hypothyroidism, unspecified: Secondary | ICD-10-CM | POA: Diagnosis not present

## 2016-10-05 DIAGNOSIS — I2510 Atherosclerotic heart disease of native coronary artery without angina pectoris: Secondary | ICD-10-CM | POA: Insufficient documentation

## 2016-10-05 DIAGNOSIS — R05 Cough: Secondary | ICD-10-CM | POA: Insufficient documentation

## 2016-10-05 DIAGNOSIS — R079 Chest pain, unspecified: Secondary | ICD-10-CM | POA: Diagnosis present

## 2016-10-05 DIAGNOSIS — Z8547 Personal history of malignant neoplasm of testis: Secondary | ICD-10-CM | POA: Insufficient documentation

## 2016-10-05 DIAGNOSIS — R0609 Other forms of dyspnea: Secondary | ICD-10-CM | POA: Insufficient documentation

## 2016-10-05 DIAGNOSIS — I10 Essential (primary) hypertension: Secondary | ICD-10-CM | POA: Diagnosis not present

## 2016-10-05 DIAGNOSIS — Z79899 Other long term (current) drug therapy: Secondary | ICD-10-CM | POA: Diagnosis not present

## 2016-10-05 DIAGNOSIS — Z7982 Long term (current) use of aspirin: Secondary | ICD-10-CM | POA: Diagnosis not present

## 2016-10-05 DIAGNOSIS — Z87891 Personal history of nicotine dependence: Secondary | ICD-10-CM | POA: Insufficient documentation

## 2016-10-05 LAB — CBC
HCT: 43.9 % (ref 39.0–52.0)
Hemoglobin: 15.4 g/dL (ref 13.0–17.0)
MCH: 29.8 pg (ref 26.0–34.0)
MCHC: 35.1 g/dL (ref 30.0–36.0)
MCV: 85.1 fL (ref 78.0–100.0)
PLATELETS: 186 10*3/uL (ref 150–400)
RBC: 5.16 MIL/uL (ref 4.22–5.81)
RDW: 12.8 % (ref 11.5–15.5)
WBC: 8.1 10*3/uL (ref 4.0–10.5)

## 2016-10-05 LAB — BASIC METABOLIC PANEL
Anion gap: 8 (ref 5–15)
BUN: 11 mg/dL (ref 6–20)
CALCIUM: 9.7 mg/dL (ref 8.9–10.3)
CO2: 26 mmol/L (ref 22–32)
CREATININE: 1.09 mg/dL (ref 0.61–1.24)
Chloride: 103 mmol/L (ref 101–111)
GFR calc Af Amer: >60 mL/min (ref >60)
Glucose, Bld: 95 mg/dL (ref 65–99)
POTASSIUM: 4 mmol/L (ref 3.5–5.1)
SODIUM: 137 mmol/L (ref 135–145)

## 2016-10-05 LAB — I-STAT TROPONIN, ED: TROPONIN I, POC: 0 ng/mL (ref 0.00–0.08)

## 2016-10-05 NOTE — ED Triage Notes (Signed)
Reports chest pain on left side that radiates down left arm that started on Friday.  Seen at [facility] today.  Reports taking 1 SL ntg with pain relief.

## 2016-10-05 NOTE — Telephone Encounter (Signed)
Spoke with the patient that he is having some numbness tingling in the left arm. He has some SOB. He went to urgent care who recommended him to talk to his cardiologist or go to the ED. Patient does not have chest pain. I cannot clearly find about his symptoms since he was vague about describing his symptoms so I recommended him to go to the ED and get evaluated. He might need a CT scan to evaluate for TIA/stroke however he is saying that he usually wakes up with the numbness/tingling in the left arm. He has long history of back problems.

## 2016-10-06 ENCOUNTER — Emergency Department: Payer: Self-pay

## 2016-10-06 ENCOUNTER — Encounter: Payer: Self-pay | Admitting: Physician Assistant

## 2016-10-06 ENCOUNTER — Emergency Department (HOSPITAL_COMMUNITY): Payer: 59

## 2016-10-06 ENCOUNTER — Emergency Department (HOSPITAL_COMMUNITY)
Admission: EM | Admit: 2016-10-06 | Discharge: 2016-10-06 | Disposition: A | Discharge status: Home / Self Care | Payer: 59 | Attending: Internal Medicine | Admitting: Internal Medicine

## 2016-10-06 DIAGNOSIS — R0600 Dyspnea, unspecified: Secondary | ICD-10-CM

## 2016-10-06 DIAGNOSIS — R05 Cough: Secondary | ICD-10-CM

## 2016-10-06 DIAGNOSIS — R079 Chest pain, unspecified: Secondary | ICD-10-CM

## 2016-10-06 DIAGNOSIS — R0609 Other forms of dyspnea: Secondary | ICD-10-CM

## 2016-10-06 DIAGNOSIS — R059 Cough, unspecified: Secondary | ICD-10-CM

## 2016-10-06 LAB — I-STAT TROPONIN, ED: TROPONIN I, POC: 0 ng/mL (ref 0.00–0.08)

## 2016-10-06 MED ORDER — AZITHROMYCIN 250 MG PO TABS: 250.0000 mg | ORAL_TABLET | Freq: Every day | ORAL | 0 refills | Status: DC

## 2016-10-06 NOTE — ED Provider Notes (Signed)
[facility] DEPT Provider Note   CSN: [id number] Arrival date & time: 10/05/16  2024     History   Chief Complaint Chief Complaint  Patient presents with  . Chest Pain    HPI Gavin Parker is a 51 y.o. male.  Patient with a history of CAD, GERD, HTN, HLD, testicular cancer presents with chest pain that has been intermittent for the past 2 days, associated with SOB and relieved with NTG. He has developed a minor cough that remains dry. No fever. No pleuritic chest pain. Yesterday he started having dizziness that was not related to chest pain. No syncope or near syncope. He notes dyspnea on exertion that has been escalating for 2 days, not always associated with chest pain. He had nausea without vomiting. No diaphoresis. He states he spoke to cardiology, Dr. Otelia Sergeant, who advised that he come in for evaluation of chest pain and possible pulmonary concerns. The patient reports he had similar respiratory symptoms in the past that was diagnosed on CT as pneumonia.       Chest Pain   Associated symptoms include dizziness, nausea and shortness of breath. Pertinent negatives include no fever and no vomiting.    Past Medical History:  Diagnosis Date  . Anginal pain ([place])   . CAD (coronary artery disease)    non obstructive per cath 09-2010  . GERD (gastroesophageal reflux disease)   . High cholesterol   . Hyperlipidemia   . Hypothyroidism   . 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 Last visit w/ urology June 2011 [facility]); last CT of  the abdomen -chest in [place]:  10/11    Patient Active Problem List   Diagnosis Date Noted  . HTN (hypertension) 05/01/2015  . Pain in the chest   . Chest pain with exertion 09/20/2012  . Breast mass in male 02/13/2012  . Foot pain 02/13/2012  . Groin pain 09/29/2011  . Eustachian tube dysfunction 09/29/2011  . Baker's cyst of knee 06/20/2011  . Rectal bleeding 06/16/2011  . Baker's cyst 06/16/2011  . Cough  01/07/2011  . Low back pain with sciatica 01/07/2011  . Hypopigmentation 11/14/2010  . CAD in native artery 10/25/2010  . Acute sinusitis 10/14/2010  . Skin lesion 10/14/2010  . Allergic rhinitis 10/01/2010  . Diarrhea 07/30/2010  . Inguinal pain 07/23/2010  . RHINITIS 05/27/2010  . TESTICULAR CANCER 03/07/2010  . EXTERNAL HEMORRHOIDS, THROMBOSED 03/07/2010  . GERD 01/23/2010  . HYPOTHYROIDISM 01/09/2010  . HLD (hyperlipidemia) 01/09/2010  . DYSPNEA ON EXERTION 01/09/2010    Past Surgical History:  Procedure Laterality Date  . CARDIAC CATHETERIZATION  2012   non-obs dz  . CARDIAC CATHETERIZATION N/A 05/01/2015   Procedure: Left Heart Cath and Coronary Angiography;  Surgeon: Belva Crome, MD;  Location: [facility];  Service: Cardiovascular;  Laterality: N/A;  . ORCHIECTOMY     R  . TONSILLECTOMY    . UMBILICAL HERNIA REPAIR         Home Medications    Prior to Admission medications   Medication Sig Start Date End Date Taking? Authorizing Provider  albuterol (VENTOLIN HFA) 108 (90 BASE) MCG/ACT inhaler Inhale 2 puffs into the lungs every 4 (four) hours as needed for wheezing or shortness of breath.     [provider]  aspirin 81 MG tablet Take 81 mg by mouth daily.      [provider]  cetirizine (ZYRTEC) 10 MG tablet Take 1 tablet (10 mg  total) by mouth daily. 07/23/10   Colon Branch, MD  fluticasone [facility]) 50 MCG/ACT nasal spray Place 2 sprays into both nostrils daily. Reported on 06/29/2015 11/22/10   Hendricks Limes, MD  lansoprazole (PREVACID) 30 MG capsule Take 30 mg by mouth every morning 04/03/15   [provider]  levothyroxine (SYNTHROID, LEVOTHROID) 112 MCG tablet Take 112 mcg by mouth daily before breakfast.    [provider]  montelukast (SINGULAIR) 10 MG tablet Take 10 mg by mouth at bedtime.    [provider]  nitroGLYCERIN (NITROSTAT) 0.4 MG SL tablet Place 1 tablet (0.4 mg total) under the tongue every  5 (five) minutes as needed for chest pain (or arm pain). 09/21/12   Rai, Vernelle Emerald, MD  simvastatin (ZOCOR) 40 MG tablet Take 40 mg by mouth daily.    [provider]  triamcinolone cream (KENALOG) 0.1 % Apply 1 application topically 2 (two) times daily. For itching 01/05/16 01/04/17  [provider]    Family History Family History  Problem Relation Age of Onset  . Arthritis Father   . Pancreatic cancer Father   . Diabetes Mother   . Ovarian cancer Mother   . Cancer Maternal Grandfather        metastatic   . Heart attack Neg Hx     Social History Social History  Substance Use Topics  . Smoking status: Former Smoker    Quit date: 03/31/2012  . Smokeless tobacco: Never Used  . Alcohol use Yes     Comment: 2 per week     Allergies   Doxycycline; Pravastatin; Rosuvastatin; Amoxicillin; Pantoprazole; and Penicillin g   Review of Systems Review of Systems  Constitutional: Negative for chills and fever.  HENT: Negative.   Respiratory: Positive for shortness of breath.   Cardiovascular: Positive for chest pain.  Gastrointestinal: Positive for nausea. Negative for vomiting.  Musculoskeletal: Negative.   Skin: Negative.   Neurological: Positive for dizziness.     Physical Exam Updated Vital Signs BP 137/85 (BP Location: Right Arm)   Pulse 65   Temp 98.4 F (36.9 C)   Resp 16   Ht 5\' 11"  (1.803 m)   Wt 104.3 kg (230 lb)   SpO2 97%   BMI 32.08 kg/m   Physical Exam  Constitutional: He is oriented to person, place, and time. He appears well-developed and well-nourished.  HENT:  Head: Normocephalic.  Neck: Normal range of motion. Neck supple.  Cardiovascular: Normal rate and regular rhythm.   No murmur heard. Pulmonary/Chest: Effort normal and breath sounds normal. He has no wheezes. He has no rales. He exhibits no tenderness.  Abdominal: Soft. Bowel sounds are normal. There is no tenderness. There is no rebound and no guarding.  Musculoskeletal:  Normal range of motion. He exhibits no edema.  Neurological: He is alert and oriented to person, place, and time.  Skin: Skin is warm and dry. No rash noted.  Psychiatric: He has a normal mood and affect.     ED Treatments / Results  Labs (all labs ordered are listed, but only abnormal results are displayed) [place], ED    EKG  EKG Interpretation None       Radiology No results found.  Procedures Procedures (including critical care time)  Medications Ordered in ED Medications - No data to display   Initial Impression / Assessment and Plan / ED Course  I have reviewed the triage vital  signs and the nursing notes.  Pertinent labs & imaging results that were available during my care of the patient were reviewed by me and considered in my medical decision making (see chart for details).    Patient presents with intermittent chest pain, SOB, DOE, dizziness and nausea. Pain is relieved with NTG x 1 on each episode. He is here at the recommendation of cardiologist he spoke to last night who felt he needed further evaluation for cardiopulmonary conditions, per patient.   Last cath 04/2015 - no lesion greater than 40%, no stents. The patient has been pain free here. No SOB. Labs, including delta trop, are negative. EKG is NSR without abnormality. CXR clear. VSS, mild bradycardia.   DDx - stable angina vs ACS vs pulmonary condition (PE felt less likely).   Dr. Kathrynn Humble will see and evaluate to determine disposition.   Patient determined to be stable for discharge home with close cardiology follow up. He voices concern for having diagnosis of pneumonia after CT scan when having symptoms similar to present symptoms. Requests CT but discussed that it would be reasonable to treat with Z-Pack rather than expose to radiation of CT scan. Patient is comfortable with plan for discharge.     Final Clinical Impressions(s) / ED  Diagnoses   Final diagnoses:  None   1. Chest pain 2. DOE  New Prescriptions New Prescriptions   No medications on file     Charlann Lange, Hershal Coria 10/06/16 [place], [place], MD 10/06/16 [phone]

## 2016-10-06 NOTE — Progress Notes (Signed)
Needs f/u  Received: Today  Message Contents  Devika Dragovich N, PA-C  P Cv Div Ch St Scheduling        Please schedule this patient for a follow-up appointment and call them with that information.   Primary Cardiologist: Stanford Breed  Date of Discharge: 10/06/2016  Appointment Needed Within: next few days  Appointment Type: requested by ER for post-ER follow-up   Thank you!  Krishna Heuer PA-C

## 2016-10-10 DIAGNOSIS — M47816 Spondylosis without myelopathy or radiculopathy, lumbar region: Secondary | ICD-10-CM | POA: Insufficient documentation

## 2016-10-17 ENCOUNTER — Ambulatory Visit (INDEPENDENT_AMBULATORY_CARE_PROVIDER_SITE_OTHER): Payer: 59 | Admitting: Physician Assistant

## 2016-10-17 ENCOUNTER — Encounter: Payer: Self-pay | Admitting: *Deleted

## 2016-10-17 ENCOUNTER — Encounter: Payer: Self-pay | Admitting: Physician Assistant

## 2016-10-17 VITALS — BP 116/77 | HR 59 | Ht 71.0 in | Wt 230.8 lb

## 2016-10-17 DIAGNOSIS — R079 Chest pain, unspecified: Secondary | ICD-10-CM

## 2016-10-17 DIAGNOSIS — E039 Hypothyroidism, unspecified: Secondary | ICD-10-CM

## 2016-10-17 DIAGNOSIS — I2510 Atherosclerotic heart disease of native coronary artery without angina pectoris: Secondary | ICD-10-CM | POA: Diagnosis not present

## 2016-10-17 DIAGNOSIS — E785 Hyperlipidemia, unspecified: Secondary | ICD-10-CM

## 2016-10-17 MED ORDER — NITROGLYCERIN 0.4 MG SL SUBL: 0.4000 mg | SUBLINGUAL_TABLET | SUBLINGUAL | 3 refills | Status: AC | PRN

## 2016-10-17 NOTE — Progress Notes (Signed)
Cardiology Office Note    Date:  10/18/2016   ID:  Gavin Parker, DOB 1966-01-03, MRN [id number]  PCP:  Stacie Glaze, DO  Cardiologist:  Dr. Stanford Breed  Chief Complaint  Patient presents with  . Follow-up    seen for Dr. Stanford Breed    History of Present Illness:  Gavin Parker is a 51 y.o. male with PMH of HLD, hypothyroidism, GERD, h/o testicular CA and CAD. Abnormal cardiac CTA in July 2012 suggested significant coronary artery disease, subsequent cardiac catheterization performed in July 2012 revealed normal left main, 20% mid LAD followed by a 40% lesion, 20% distal LAD lesion, 40% proximal RCA lesion, normal LV function. Echocardiogram in June 2015 showed normal LV function. Myoview in June 2015 showed mild inferior septal defect that was partially reversible but not felt significant for ischemia. He was admitted with chest pain concerning for unstable angina in January 2017, cardiac catheterization performed at that time showed 30% proximal RCA, 40% mid LAD lesion, 20% proximal left circumflex lesion, otherwise nonobstructive CAD. He was seen in the ED on 01/13/2016 and again with chest pain. This was felt to be cervical/lumbar radiculopathy. His symptom was felt to be very atypical for angina. He was most recently seen in the ED again on 10/06/2016 with intermittent chest pain relieved with nitroglycerin. Point-of-care troponin was negative 2. EKG showed no obvious ischemia. Apparently he had pneumonia in the past with similar presentation. He was prophylactically treated with a course of Z-Pak on discharge.   He presents today for cardiology office evaluation. He was quite frustrated with history of recent ED experience. He says he was originally told he will get a CT of the chest to rule out pneumonia, however he was sent home instead. He says he was quite worried about possibility of PE since he had medial meniscus knee surgery prior to that. He saw his orthopedic surgeon Dr.  Laurance Parker at [place], given chest discomfort and a sudden shortness of breath, he was sent to [facility] ED for CT angiogram of the chest to rule out PE. CT angiogram of the chest obtained on 10/07/2016 was negative for PE or any acute intrathoracic disease. Since then, he has finished the course of azithromycin. His chest discomfort and shortness of breath has also resolved. Given negative change on recent EKG and a reassuring cardiac cath last year, I did not recommend any additional workup. The last time he has been seen by Dr. Stanford Breed was in 2015, I will arrange 6-9 month follow-up with M.D. only. We will refill his nitroglycerin. Last lipid panel obtained at [facility] showed significant improvement in total cholesterol, triglycerides, LDL and HDL when compared to 2016 lab. LDL still mildly elevated around 86. I encouraged diet and exercise once his left knee issue resolved.    Past Medical History:  Diagnosis Date  . Anginal pain ([place])   . CAD (coronary artery disease)    non obstructive per cath 09-2010  . GERD (gastroesophageal reflux disease)   . High cholesterol   . Hyperlipidemia   . Hypothyroidism   . 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 Last visit w/ urology June 2011 ([place]); last CT of  the abdomen -chest in [place]:  10/11    Past Surgical History:  Procedure Laterality Date  . CARDIAC CATHETERIZATION  2012   non-obs dz  . CARDIAC CATHETERIZATION N/A 05/01/2015   Procedure: Left Heart Cath and Coronary Angiography;  Surgeon: Belva Crome, MD;  Location: [place] [facility];  Service: Cardiovascular;  Laterality: N/A;  . ORCHIECTOMY     R  . TONSILLECTOMY    . UMBILICAL HERNIA REPAIR      Current Medications: Outpatient Medications Prior to Visit  Medication Sig Dispense Refill  . albuterol (VENTOLIN HFA) 108 (90 BASE) MCG/ACT inhaler Inhale 2 puffs into the lungs every 4 (four) hours as needed for wheezing or shortness of breath.     Marland Kitchen aspirin 81 MG  tablet Take 81 mg by mouth daily.      Marland Kitchen azithromycin (ZITHROMAX) 250 MG tablet Take 1 tablet (250 mg total) by mouth daily. Take first 2 tablets together, then 1 every day until finished. 6 tablet 0  . cetirizine (ZYRTEC) 10 MG tablet Take 1 tablet (10 mg total) by mouth daily. 30 tablet 6  . fluticasone (FLONASE) 50 MCG/ACT nasal spray Place 2 sprays into both nostrils daily. Reported on 06/29/2015    . lansoprazole (PREVACID) 30 MG capsule Take 30 mg by mouth every morning    . levothyroxine (SYNTHROID, LEVOTHROID) 112 MCG tablet Take 112 mcg by mouth daily before breakfast.    . montelukast (SINGULAIR) 10 MG tablet Take 10 mg by mouth at bedtime.    . simvastatin (ZOCOR) 40 MG tablet Take 40 mg by mouth daily.    Marland Kitchen triamcinolone cream (KENALOG) 0.1 % Apply 1 application topically 2 (two) times daily as needed (rash). For itching    . nitroGLYCERIN (NITROSTAT) 0.4 MG SL tablet Place 1 tablet (0.4 mg total) under the tongue every 5 (five) minutes as needed for chest pain (or arm pain). 30 tablet 12   No facility-administered medications prior to visit.      Allergies:   Doxycycline; Pravastatin; Rosuvastatin; Amoxicillin; Pantoprazole; and Penicillin g   Social History   Social History  . Marital status: Married    Spouse name: N/A  . Number of children: 3  . Years of education: N/A   Occupational History  . supervisor Radio broadcast assistant   Social History Main Topics  . Smoking status: Former Smoker    Quit date: 03/31/2012  . Smokeless tobacco: Never Used  . Alcohol use Yes     Comment: 2 per week  . Drug use: No  . Sexual activity: Not Asked   Other Topics Concern  . None   Social History Narrative  . None     Family History:  The patient's family history includes Arthritis in his father; Cancer in his maternal grandfather; Diabetes in his mother; Ovarian cancer in his mother; Pancreatic cancer in his father.   ROS:   Please see the history of present illness.    ROS All other  systems reviewed and are negative.   PHYSICAL EXAM:   VS:  BP 116/77   Pulse (!) 59   Ht 5\' 11"  (1.803 m)   Wt 230 lb 12.8 oz (104.7 kg)   SpO2 97%   BMI 32.19 kg/m    GEN: Well nourished, well developed, in no acute distress  HEENT: normal  Neck: no JVD, carotid bruits, or masses Cardiac: RRR; no murmurs, rubs, or gallops,no edema  Respiratory:  clear to auscultation bilaterally, normal work of breathing GI: soft, nontender, nondistended, + BS MS: no deformity or atrophy  Skin: warm and dry, no rash Neuro:  Alert and Oriented x 3, Strength and sensation are intact Psych: euthymic mood, full affect  Wt Readings from Last 3 Encounters:  10/17/16 230 lb 12.8 oz (104.7 kg)  10/05/16 230 lb (104.3 kg)  01/21/16 238 lb 12.8 oz (108.3 kg)      Studies/Labs Reviewed:   EKG:  EKG is not ordered today.    Recent Labs: 10/05/2016: BUN 11; Creatinine, Ser 1.09; Hemoglobin 15.4; Platelets 186; Potassium 4.0; Sodium 137   Lipid Panel    Component Value Date/Time   CHOL 121 09/21/2012 0745   TRIG 143 09/21/2012 0745   HDL 33 (L) 09/21/2012 0745   CHOLHDL 3.7 09/21/2012 0745   VLDL 29 09/21/2012 0745   LDLCALC 59 09/21/2012 0745    Additional studies/ records that were reviewed today include:   Cath 04/2015 Conclusion    Prox RCA lesion, 30% stenosed.  Mid LAD lesion, 40% stenosed.  Prox Cx to Dist Cx lesion, 20% stenosed.    Widely patent coronary arteries with minimal atherosclerosis but no lesion greater than 40%.  Normal left ventricular function  No change in anatomy we compared to prior angiogram   Recommendations:   Consider other diagnoses to explain the patient's symptoms.      ASSESSMENT:    1. Chest pain, unspecified type   2. Coronary artery disease involving native coronary artery of native heart without angina pectoris   3. Hyperlipidemia, unspecified hyperlipidemia type   4. Hypothyroidism, unspecified type      PLAN:  In order of  problems listed above:  1. Atypical chest pain: Had a recent CTA at [facility] that was negative for PE, denies any further chest discomfort since. Given reassuring cardiac catheterization last year, I would not recommend any further testing at this time. I would recommend continue observation, he will contact us if the chest pain returned. I have refilled his sublingual nitroglycerin. . 2. CAD: last cath January 2017, cardiac catheterization performed at that time showed 30% proximal RCA, 40% mid LAD lesion, 20% proximal left circumflex lesion, otherwise nonobstructive CAD  3. Hyperlipidemia: On simvastatin 40 mg daily. Last lipid panel obtained at [facility] showed well-controlled cholesterol, LDL is borderline in the 80s, I encouraged increased diet and exercise once he recovered from his knee issue.  4. Hypothyroidism: on Synthroid    Medication Adjustments/Labs and Tests Ordered: Current medicines are reviewed at length with the patient today.  Concerns regarding medicines are outlined above.  Medication changes, Labs and Tests ordered today are listed in the Patient Instructions below. Patient Instructions  Medication Instructions:   No changes. We sent a refill authorization to your pharmacy for Nitroglycerin.  Labwork:   none  Testing/Procedures:  none  Follow-Up:  With Dr. Stanford Breed in 6-9 months.  If you need a refill on your cardiac medications before your next appointment, please call your pharmacy.      Hilbert Corrigan, [place]  10/18/2016 11:33 PM    [facility] [place], [place], [place]  [address] Phone: [phone]; Fax: [phone]

## 2016-10-17 NOTE — Patient Instructions (Signed)
Medication Instructions:   No changes. We sent a refill authorization to your pharmacy for Nitroglycerin.  Labwork:   none  Testing/Procedures:  none  Follow-Up:  With Dr. Stanford Breed in 6-9 months.  If you need a refill on your cardiac medications before your next appointment, please call your pharmacy.

## 2016-10-18 ENCOUNTER — Encounter: Payer: Self-pay | Admitting: Physician Assistant

## 2017-07-07 DIAGNOSIS — M1711 Unilateral primary osteoarthritis, right knee: Secondary | ICD-10-CM | POA: Insufficient documentation

## 2017-07-13 DIAGNOSIS — S62002A Unspecified fracture of navicular [scaphoid] bone of left wrist, initial encounter for closed fracture: Secondary | ICD-10-CM | POA: Insufficient documentation

## 2017-09-06 NOTE — Progress Notes (Signed)
Cardiology Office Note   Date:  09/07/2017   ID:  Gavin Parker, DOB September 25, 1965, MRN [id number]  PCP:  Stacie Glaze, DO  Cardiologist: [facility] Chief Complaint  Patient presents with  . Follow-up     History of Present Illness: Gavin Parker is a 52 y.o. male who presents for ongoing assessment and management of coronary artery disease, with CTA in July 2012 suggesting significant coronary artery disease, with catheterization July 2012 revealing a normal left main, 20% mid LAD, 40% lesion thereafter, 20% distal LAD lesion, 40% RCA lesion, with normal LV systolic function.    Follow-up catheterization in the setting of recurrent chest pain, and concern for unstable angina, revealed a 30% proximal RCA, 40% mid LAD lesion, 20% proximal left circumflex lesion with otherwise nonobstructive CAD.  He has been seen in the emergency room for recurrent chest pain on 2 further occasions and found to have atypical angina.  It was found that the patient had cervical/lumbar radiculopathy.  He was last seen in the office by Almyra Deforest, PA on 10/17/2016.  He was documented to be seen by [facility] for recurrent chest pain, also in the ED, was not recommended any additional cardiac work-up, and had been ruled out for PE.  He was treated for pneumonia.  The patient has had medial meniscus knee surgery, and once recovered was recommended for increase exercise.  Other history includes GERD, hyperlipidemia.  He offers no complaints of chest pain, has not needed to take NTG. He has gained some weight due to sequential knee surgeries but wants to become more active once he finishes PT. He has been seen by PCP and had recent labs in April of 2019.   Past Medical History:  Diagnosis Date  . Anginal pain ([place])   . CAD (coronary artery disease)    non obstructive per cath 09-2010  . GERD (gastroesophageal reflux disease)   . High cholesterol   . Hyperlipidemia   . Hypothyroidism   . 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 Last visit w/ urology June 2011 ([place]); last CT of  the abdomen -chest in [place]:  10/11    Past Surgical History:  Procedure Laterality Date  . CARDIAC CATHETERIZATION  2012   non-obs dz  . CARDIAC CATHETERIZATION N/A 05/01/2015   Procedure: Left Heart Cath and Coronary Angiography;  Surgeon: Belva Crome, MD;  Location: [place] [facility];  Service: Cardiovascular;  Laterality: N/A;  . ORCHIECTOMY     R  . TONSILLECTOMY    . UMBILICAL HERNIA REPAIR       Current Outpatient Medications  Medication Sig Dispense Refill  . albuterol (VENTOLIN HFA) 108 (90 BASE) MCG/ACT inhaler Inhale 2 puffs into the lungs every 4 (four) hours as needed for wheezing or shortness of breath.     Marland Kitchen aspirin 81 MG tablet Take 81 mg by mouth daily.      . cetirizine (ZYRTEC) 10 MG tablet Take 1 tablet (10 mg total) by mouth daily. 30 tablet 6  . Cholecalciferol (VITAMIN D) 2000 units tablet Take 1 tablet by mouth every morning.    . fluticasone (FLONASE) 50 MCG/ACT nasal spray Place 2 sprays into both nostrils daily. Reported on 06/29/2015    . gabapentin (NEURONTIN) 300 MG capsule Take 600 mg by mouth 3 (three) times daily.  3  . levothyroxine (SYNTHROID, LEVOTHROID) 112 MCG tablet Take 112 mcg by mouth daily before breakfast.    . montelukast (SINGULAIR)  10 MG tablet Take 10 mg by mouth at bedtime.    . nitroGLYCERIN (NITROSTAT) 0.4 MG SL tablet Place 1 tablet (0.4 mg total) under the tongue every 5 (five) minutes as needed for chest pain (or arm pain). 25 tablet 3  . RABEprazole (ACIPHEX) 20 MG tablet Take 1 tablet by mouth daily before breakfast.    . simvastatin (ZOCOR) 40 MG tablet Take 40 mg by mouth daily.     No current facility-administered medications for this visit.     Allergies:   Atorvastatin; Doxycycline; Pravastatin; Rosuvastatin; Statins; Amoxicillin; Amoxicillin-pot clavulanate; Pantoprazole; and Penicillin g    Social History:  The patient   reports that he quit smoking about 5 years ago. He has never used smokeless tobacco. He reports that he drinks alcohol. He reports that he does not use drugs.   Family History:  The patient's family history includes Arthritis in his father; Cancer in his maternal grandfather; Diabetes in his mother; Ovarian cancer in his mother; Pancreatic cancer in his father.    ROS: All other systems are reviewed and negative. Unless otherwise mentioned in H&P    PHYSICAL EXAM: VS:  BP 112/78   Pulse (!) 57   Ht 5\' 11"  (1.803 m)   Wt 253 lb 9.6 oz (115 kg)   BMI 35.37 kg/m  , BMI Body mass index is 35.37 kg/m. GEN: Well nourished, well developed, in no acute distress  HEENT: normal  Neck: no JVD, carotid bruits, or masses Cardiac: RRR; no murmurs, rubs, or gallops,no edema  Respiratory: Clear to auscultation bilaterally, normal work of breathing GI: soft, nontender, nondistended, + BS MS: no deformity or atrophy Wearing a wrist brace on the left.  Skin: warm and dry, no rash Neuro:  Strength and sensation are intact Psych: euthymic mood, full affect   EKG:  Sinus bradycardia, rate of 57 bpm.    Recent Labs: 10/05/2016: BUN 11; Creatinine, Ser 1.09; Hemoglobin 15.4; Platelets 186; Potassium 4.0; Sodium 137    Lipid Panel    Component Value Date/Time   CHOL 121 09/21/2012 0745   TRIG 143 09/21/2012 0745   HDL 33 (L) 09/21/2012 0745   CHOLHDL 3.7 09/21/2012 0745   VLDL 29 09/21/2012 0745   LDLCALC 59 09/21/2012 0745      Wt Readings from Last 3 Encounters:  09/07/17 253 lb 9.6 oz (115 kg)  10/17/16 230 lb 12.8 oz (104.7 kg)  10/05/16 230 lb (104.3 kg)      Other studies Reviewed: Cardiac Cath 04/2015 Conclusion    Prox RCA lesion, 30% stenosed.  Mid LAD lesion, 40% stenosed.  Prox Cx to Dist Cx lesion, 20% stenosed.   Widely patent coronary arteries with minimal atherosclerosis but no lesion greater than 40%.  Normal left ventricular function  No change in anatomy  we compared to prior angiogram    ASSESSMENT AND PLAN:  1. Chronic chest pain: Noncardiac in etiology per recent work up. He has not needed to take NTG for pain relief.  2. CAD: Non-obstructive per cath in 2017. Ok from our standpoint to use Viagra if he does not use NTG concomitant within 24 hours.   3. Hypercholesterolemia:  Continues on statin therapy. Labs recently completed by PCP via [facility]. I have reviewed them. LDL 85. TC 155.   4.Musculoskeletal pain: Cervical, lumbar, and carpal tunnel pain. Is being seen by orthopedist. He has had bilateral knee surgeries since being seen last.    Current medicines are reviewed at length with the  patient today.    Labs/ tests ordered today include: None  Phill Myron. [place] Pugh, ANP, AACC   09/07/2017 12:44 PM    [facility] [address] [place], [place] [address] Phone: [phone]; Fax: [phone]

## 2017-09-07 ENCOUNTER — Ambulatory Visit (INDEPENDENT_AMBULATORY_CARE_PROVIDER_SITE_OTHER): Payer: 59 | Admitting: Adult Health

## 2017-09-07 ENCOUNTER — Encounter: Payer: Self-pay | Admitting: Adult Health

## 2017-09-07 VITALS — BP 112/78 | HR 57 | Ht 71.0 in | Wt 253.6 lb

## 2017-09-07 DIAGNOSIS — E7800 Pure hypercholesterolemia, unspecified: Secondary | ICD-10-CM

## 2017-09-07 DIAGNOSIS — I10 Essential (primary) hypertension: Secondary | ICD-10-CM

## 2017-09-07 DIAGNOSIS — R079 Chest pain, unspecified: Secondary | ICD-10-CM | POA: Diagnosis not present

## 2017-09-07 NOTE — Patient Instructions (Signed)
Medication Instructions:  NO CHANGES- Your physician recommends that you continue on your current medications as directed. Please refer to the Current Medication list given to you today.  If you need a refill on your cardiac medications before your next appointment, please call your pharmacy.  Follow-Up: Your physician wants you to follow-up in: 12 months with Dr Trellis Moment should receive a reminder letter in the mail two months in advance. If you do not receive a letter, please call our office 03 July 2018 to schedule the June 2020 follow-up appointment.   Thank you for choosing [facility] at [facility]!!

## 2017-12-27 IMAGING — DX DG CHEST 2V
2 series · 2 of 2 positions shown · non-contrast
Comparison: 05/01/2015 and prior exams

CLINICAL DATA: Shortness of breath and cough.

EXAM:
CHEST  2 VIEW

[chest pa]
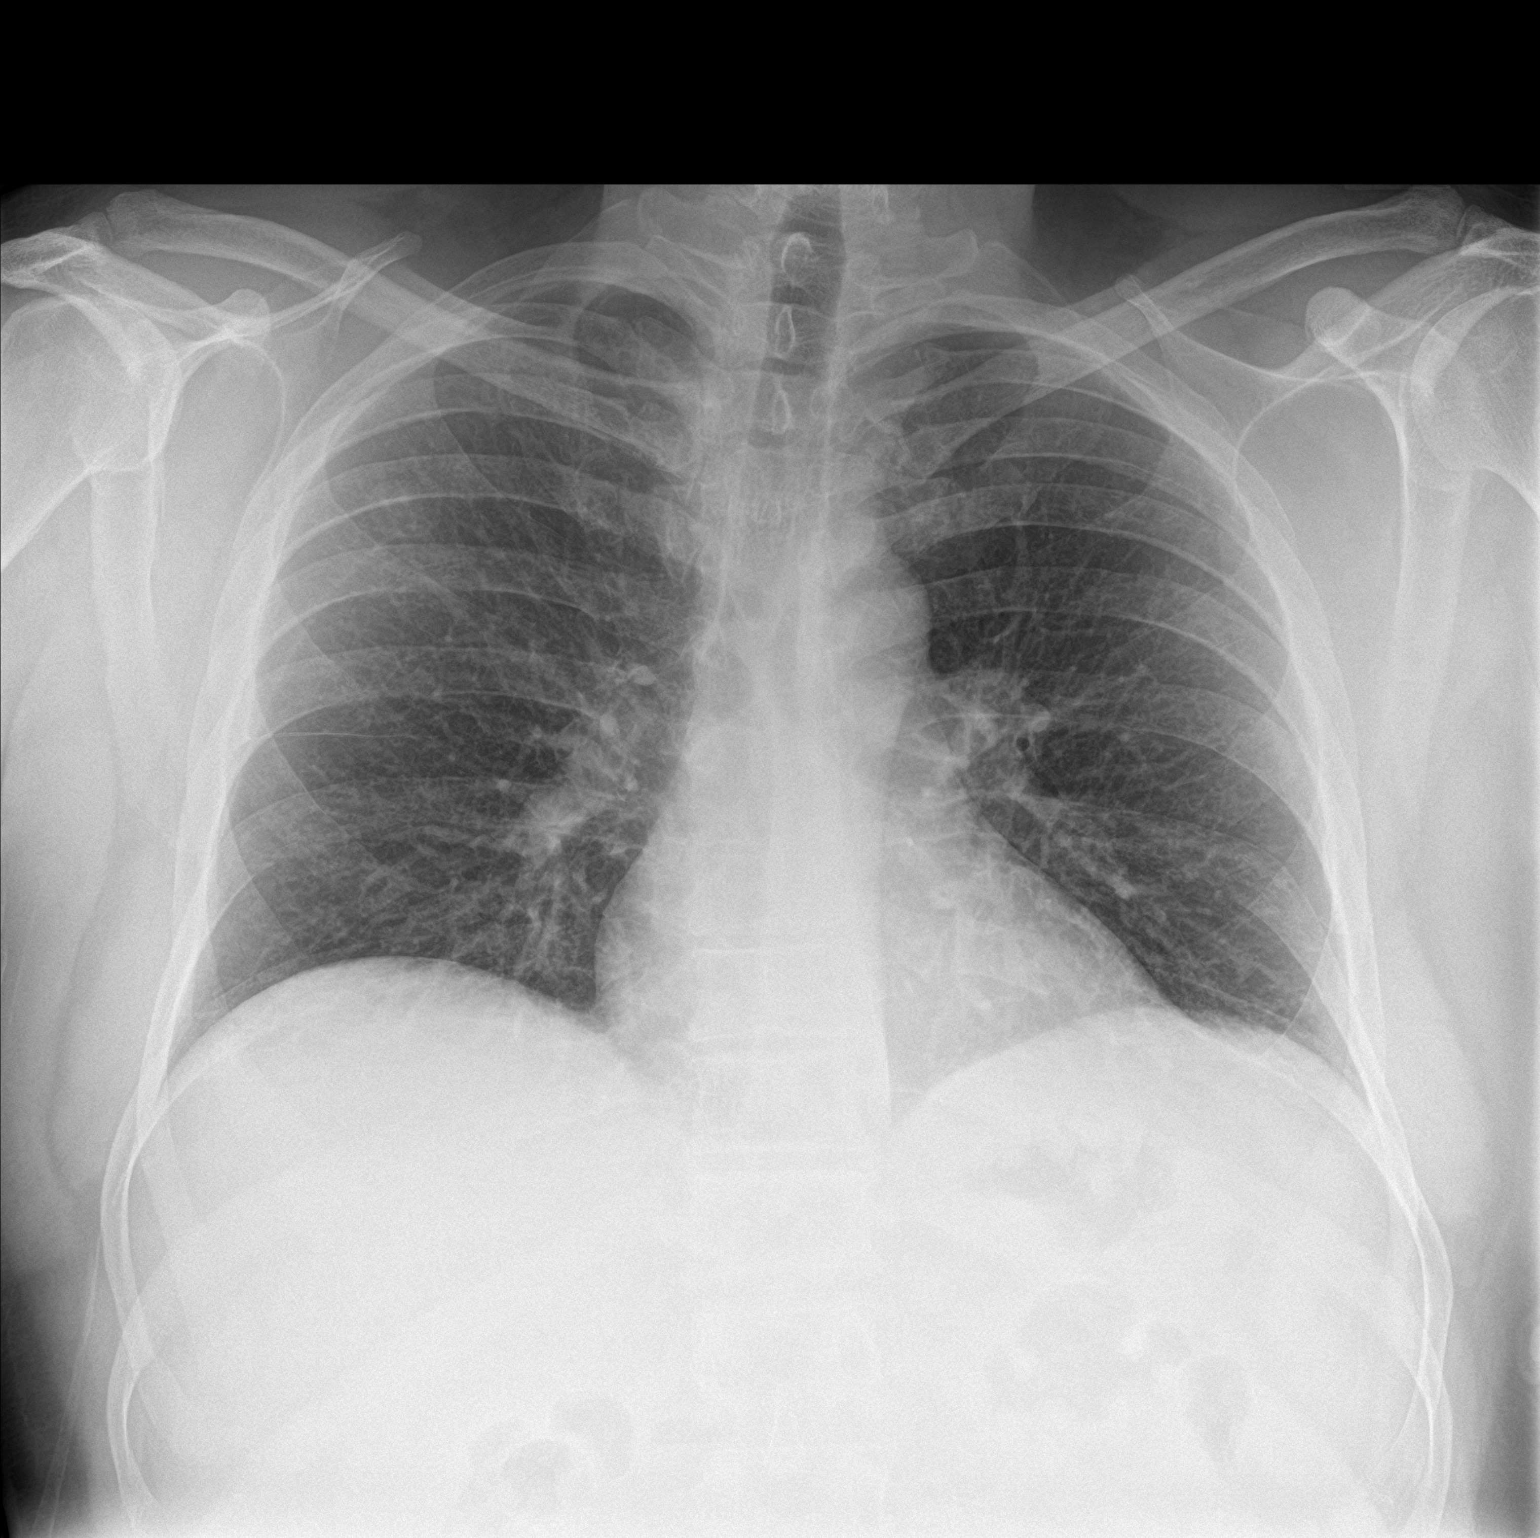

[chest lat]
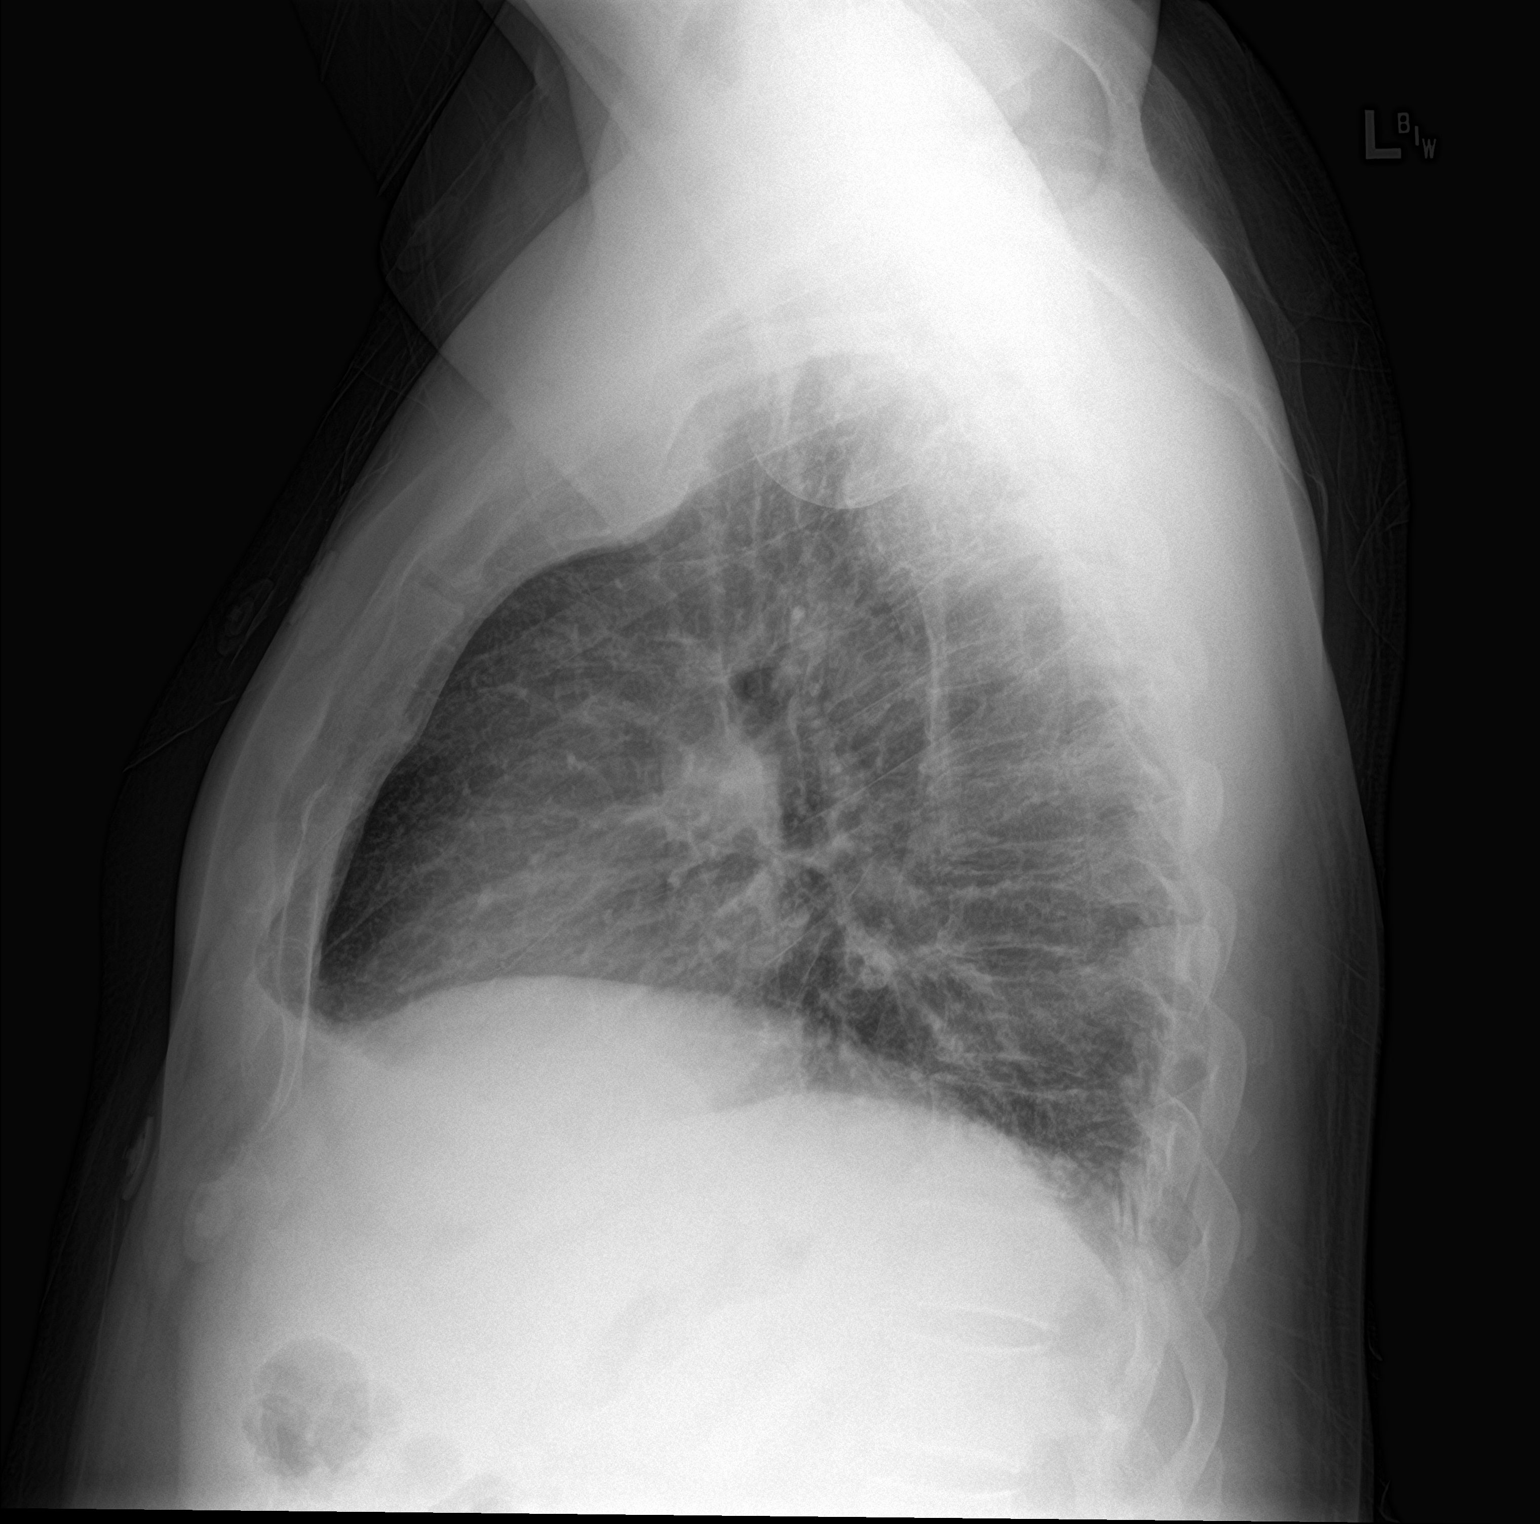

[2 of 2 positions shown; findings below may reference images not displayed]

FINDINGS: The cardiomediastinal silhouette is unremarkable.

There is no evidence of focal airspace disease, pulmonary edema,
suspicious pulmonary nodule/mass, pleural effusion, or pneumothorax.
No acute bony abnormalities are identified.
IMPRESSION: No active cardiopulmonary disease.

## 2018-01-18 ENCOUNTER — Ambulatory Visit (INDEPENDENT_AMBULATORY_CARE_PROVIDER_SITE_OTHER): Payer: 59 | Admitting: Podiatry

## 2018-01-18 ENCOUNTER — Other Ambulatory Visit: Payer: Self-pay | Admitting: Podiatry

## 2018-01-18 ENCOUNTER — Encounter: Payer: Self-pay | Admitting: Podiatry

## 2018-01-18 ENCOUNTER — Ambulatory Visit (INDEPENDENT_AMBULATORY_CARE_PROVIDER_SITE_OTHER): Payer: 59

## 2018-01-18 VITALS — BP 119/74 | HR 64 | Resp 16

## 2018-01-18 DIAGNOSIS — M779 Enthesopathy, unspecified: Secondary | ICD-10-CM

## 2018-01-18 DIAGNOSIS — M2041 Other hammer toe(s) (acquired), right foot: Secondary | ICD-10-CM | POA: Diagnosis not present

## 2018-01-18 DIAGNOSIS — M778 Other enthesopathies, not elsewhere classified: Secondary | ICD-10-CM

## 2018-01-18 DIAGNOSIS — M7751 Other enthesopathy of right foot: Secondary | ICD-10-CM | POA: Diagnosis not present

## 2018-01-18 MED ORDER — TRIAMCINOLONE ACETONIDE 10 MG/ML IJ SUSP
10.0000 mg | Freq: Once | INTRAMUSCULAR | Status: AC
  Administered 2018-01-18: 10 mg

## 2018-01-18 NOTE — Progress Notes (Signed)
   Subjective:    Patient ID: Gavin Parker, male    DOB: 1965/09/12, 52 y.o.   MRN: [id number]  HPI    Review of Systems  All other systems reviewed and are negative.      Objective:   Physical Exam        Assessment & Plan:

## 2018-01-21 NOTE — Progress Notes (Signed)
Subjective:   Patient ID: Gavin Parker, male   DOB: 52 y.o.   MRN: [id number]   HPI Patient presents stating he knows that he has poor foot structure and is developed pain in his right foot and had previous bunionectomy done several years ago by Dr. mother should which did okay but he does have continued problems with his foot and again has difficult foot structure.  Patient does not smoke and likes to be active   Review of Systems  All other systems reviewed and are negative.       Objective:  Physical Exam  Constitutional: He appears well-developed and well-nourished.  Cardiovascular: Intact distal pulses.  Pulmonary/Chest: Effort normal.  Musculoskeletal: Normal range of motion.  Neurological: He is alert.  Skin: Skin is warm.  Nursing note and vitals reviewed.   Neurovascular status found to be intact muscle strength is adequate range of motion within normal limits with significant adductus foot structure right with corrected bunion deformity inflammation pain around the second MPJ with fluid buildup and moderate depression of the arch noted bilateral.  There is increased pressure against all metatarsals but the second is the one that bothers him the most currently.  Patient has good digital perfusion is well oriented x3     Assessment:  Acute capsulitis second MPJ right with moderate structural changes and bunion deformity left with the right when having been corrected with moderate deformity still noted     Plan:  H&P both conditions and x-rays reviewed today I did a proximal nerve block right I aspirated the second MPJ getting out a small amount of clear fluid and I injected with quarter cc dexamethasone Kenalog applied thick plantar padding discussed long-term orthotics for condition.  Reappoint for [place] to recheck  X-ray indicates there is significant adductus foot structure with history of bunion deformity with correction right with good reduction of the intermetatarsal  angle in satisfactory position of the joint surface

## 2018-02-01 ENCOUNTER — Encounter: Payer: Self-pay | Admitting: Podiatry

## 2018-02-01 ENCOUNTER — Ambulatory Visit (INDEPENDENT_AMBULATORY_CARE_PROVIDER_SITE_OTHER): Payer: 59 | Admitting: Podiatry

## 2018-02-01 DIAGNOSIS — M779 Enthesopathy, unspecified: Secondary | ICD-10-CM

## 2018-02-01 DIAGNOSIS — M2041 Other hammer toe(s) (acquired), right foot: Secondary | ICD-10-CM

## 2018-02-01 NOTE — Progress Notes (Signed)
Foot orthtotc note: 2d polypro firm, 58m deep heel, wide, spenco 334m 2nd mpj R offload, small met pad 2-4 b/l RiLiliane Channel

## 2018-02-03 NOTE — Progress Notes (Signed)
Subjective:   Patient ID: Gavin Parker, male   DOB: 52 y.o.   MRN: [id number]   HPI Patient states he is improved but still having pain and when he walks on cement floors he continues to have issues with his feet   ROS      Objective:  Physical Exam  Neurovascular status intact with patient's plantar capsule right over left inflamed but improved from previous visit.  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  Patient will be seen back for dispensing in approximately 3 weeks and will rigid shoes until them and utilize metatarsal pads

## 2018-02-22 ENCOUNTER — Ambulatory Visit: Payer: 59 | Admitting: Orthotics

## 2018-02-22 DIAGNOSIS — M2041 Other hammer toe(s) (acquired), right foot: Secondary | ICD-10-CM

## 2018-02-22 NOTE — Progress Notes (Signed)
Patient came in today to pick up custom made foot orthotics.  The goals were accomplished and the patient reported no dissatisfaction with said orthotics.  Patient was advised of breakin period and how to report any issues. 

## 2018-10-06 NOTE — Progress Notes (Signed)
Virtual Visit via Video Note   This visit type was conducted due to national recommendations for restrictions regarding the COVID-19 Pandemic (e.g. social distancing) in an effort to limit this patient's exposure and mitigate transmission in our community.  Due to his co-morbid illnesses, this patient is at least at moderate risk for complications without adequate follow up.  This format is felt to be most appropriate for this patient at this time.  All issues noted in this document were discussed and addressed.  A limited physical exam was performed with this format.  Please refer to the patient's chart for his consent to telehealth for [facility].   Date:  10/08/2018   ID:  Gavin Parker, DOB 1965-12-18, MRN [id number]  Patient Location:Home Provider Location: Home  PCP:  Stacie Glaze, DO  Cardiologist:  Dr Stanford Breed  Evaluation Performed:  Follow-Up Visit  Chief Complaint:  FU CAD  History of Present Illness:    FU CAD.  Patient has had difficulties with chronic chest pain.  Echocardiogram June 2015 showed normal LV function.  Cardiac catheterization January 2017 showed 30% RCA, 40% LAD and 20% circumflex.  Normal LV function. Since he was last seen the patient denies any dyspnea on exertion, orthopnea, PND, pedal edema, palpitations, syncope or chest pain.   The patient does not have symptoms concerning for COVID-19 infection (fever, chills, cough, or new shortness of breath).    Past Medical History:  Diagnosis Date  . Anginal pain ([place])   . CAD (coronary artery disease)    non obstructive per cath 09-2010  . GERD (gastroesophageal reflux disease)   . High cholesterol   . Hyperlipidemia   . Hypothyroidism   . 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 Last visit w/ urology June 2011 ([place]); last CT of  the abdomen -chest in [place]:  10/11   Past Surgical History:  Procedure Laterality Date  . CARDIAC CATHETERIZATION  2012   non-obs dz  . CARDIAC CATHETERIZATION N/A 05/01/2015   Procedure: Left Heart Cath and Coronary Angiography;  Surgeon: Belva Crome, MD;  Location: [facility];  Service: Cardiovascular;  Laterality: N/A;  . ORCHIECTOMY     R  . TONSILLECTOMY    . UMBILICAL HERNIA REPAIR       Current Meds  Medication Sig  . albuterol (VENTOLIN HFA) 108 (90 BASE) MCG/ACT inhaler Inhale 2 puffs into the lungs every 4 (four) hours as needed for wheezing or shortness of breath.   Marland Kitchen aspirin 81 MG tablet Take 81 mg by mouth daily.    . cetirizine (ZYRTEC) 10 MG tablet Take 1 tablet (10 mg total) by mouth daily.  . Cholecalciferol (VITAMIN D) 2000 units tablet Take 1 tablet by mouth every morning.  . fluticasone (FLONASE) 50 MCG/ACT nasal spray Place 2 sprays into both nostrils daily. Reported on 06/29/2015  . gabapentin (NEURONTIN) 300 MG capsule Take 600 mg by mouth 3 (three) times daily.  Marland Kitchen levothyroxine (SYNTHROID, LEVOTHROID) 112 MCG tablet Take 112 mcg by mouth daily before breakfast.  . liothyronine (CYTOMEL) 5 MCG tablet Take 5 mcg by mouth daily.  . montelukast (SINGULAIR) 10 MG tablet Take 10 mg by mouth at bedtime.  . nitroGLYCERIN (NITROSTAT) 0.4 MG SL tablet Place 1 tablet (0.4 mg total) under the tongue every 5 (five) minutes as needed for chest pain (or arm pain).  . RABEprazole (ACIPHEX) 20 MG tablet Take 1 tablet by mouth daily before breakfast.  .  simvastatin (ZOCOR) 40 MG tablet Take 40 mg by mouth daily.     Allergies:   Atorvastatin, Doxycycline, Pravastatin, Rosuvastatin, Statins, Amoxicillin, Amoxicillin-pot clavulanate, Pantoprazole, and Penicillin g   Social History   Tobacco Use  . Smoking status: Former Smoker    Quit date: 03/31/2012    Years since quitting: 6.5  . Smokeless tobacco: Never Used  Substance Use Topics  . Alcohol use: Yes    Comment: 2 per week  . Drug use: No     Family Hx: The patient's family history includes Arthritis in his father; Cancer in his  maternal grandfather; Diabetes in his mother; Ovarian cancer in his mother; Pancreatic cancer in his father. There is no history of Heart attack.  ROS:   Please see the history of present illness.    No Fever, chills  or productive cough All other systems reviewed and are negative.   Recent Lipid Panel Lab Results  Component Value Date/Time   CHOL 121 09/21/2012 07:45 AM   TRIG 143 09/21/2012 07:45 AM   HDL 33 (L) 09/21/2012 07:45 AM   CHOLHDL 3.7 09/21/2012 07:45 AM   LDLCALC 59 09/21/2012 07:45 AM    Wt Readings from Last 3 Encounters:  10/08/18 240 lb (108.9 kg)  09/07/17 253 lb 9.6 oz (115 kg)  10/17/16 230 lb 12.8 oz (104.7 kg)     Objective:    Vital Signs:  Ht 5\' 11"  (1.803 m)   Wt 240 lb (108.9 kg)   BMI 33.47 kg/m    VITAL SIGNS:  reviewed NAD Answers questions appropriately Normal affect Remainder of physical examination not performed (telehealth visit; coronavirus pandemic)  ASSESSMENT & PLAN:    1. Coronary artery disease-patient denies chest pain.  Continue medical therapy with aspirin and statin. 2. Hyperlipidemia-continue statin. 3. Chest pain-no recent symptoms.  COVID-19 Education: The importance of social distancing was discussed today.  Time:   Today, I have spent 18 minutes with the patient with telehealth technology discussing the above problems.     Medication Adjustments/Labs and Tests Ordered: Current medicines are reviewed at length with the patient today.  Concerns regarding medicines are outlined above.   Tests Ordered: No orders of the defined types were placed in this encounter.   Medication Changes: No orders of the defined types were placed in this encounter.   Follow Up:  Virtual Visit or In Person in 1 year(s)  Signed, Kirk Ruths, MD  10/08/2018 9:02 AM    [place]

## 2018-10-07 ENCOUNTER — Telehealth: Payer: Self-pay | Admitting: Cardiology

## 2018-10-07 NOTE — Telephone Encounter (Signed)
I called pt to confirm his appt for 10-08-18 with Dr Stanford Breed.      1. Confirm consent - "In the setting of the current Covid19 crisis, you are scheduled for a (phone or video) visit with your provider on (date) at (time).  Just as we do with many in-office visits, in order for you to participate in this visit, we must obtain consent.  If you'd like, I can send this to your mychart (if signed up) or email for you to review.  Otherwise, I can obtain your verbal consent now.  All virtual visits are billed to your insurance company just like a normal visit would be.  By agreeing to a virtual visit, we'd like you to understand that the technology does not allow for your provider to perform an examination, and thus may limit your provider's ability to fully assess your condition. If your provider identifies any concerns that need to be evaluated in person, we will make arrangements to do so.  Finally, though the technology is pretty good, we cannot assure that it will always work on either your or our end, and in the setting of a video visit, we may have to convert it to a phone-only visit.  In either situation, we cannot ensure that we have a secure connection.  Are you willing to proceed?" STAFF: Did the patient verbally acknowledge consent to telehealth visit? Document YES/NO here:  Yes       FULL LENGTH CONSENT FOR TELE-HEALTH VISIT   I hereby voluntarily request, consent and authorize [facility] and its employed or contracted physicians, physician assistants, nurse practitioners or other licensed health care professionals (the Practitioner), to provide me with telemedicine health care services (the Services") as deemed necessary by the treating Practitioner. I acknowledge and consent to receive the Services by the Practitioner via telemedicine. I understand that the telemedicine visit will involve communicating with the Practitioner through live audiovisual communication technology and the  disclosure of certain medical information by electronic transmission. I acknowledge that I have been given the opportunity to request an in-person assessment or other available alternative prior to the telemedicine visit and am voluntarily participating in the telemedicine visit.  I understand that I have the right to withhold or withdraw my consent to the use of telemedicine in the course of my care at any time, without affecting my right to future care or treatment, and that the Practitioner or I may terminate the telemedicine visit at any time. I understand that I have the right to inspect all information obtained and/or recorded in the course of the telemedicine visit and may receive copies of available information for a reasonable fee.  I understand that some of the potential risks of receiving the Services via telemedicine include:   Delay or interruption in medical evaluation due to technological equipment failure or disruption;  Information transmitted may not be sufficient (e.g. poor resolution of images) to allow for appropriate medical decision making by the Practitioner; and/or   In rare instances, security protocols could fail, causing a breach of personal health information.  Furthermore, I acknowledge that it is my responsibility to provide information about my medical history, conditions and care that is complete and accurate to the best of my ability. I acknowledge that Practitioner's advice, recommendations, and/or decision may be based on factors not within their control, such as incomplete or inaccurate data provided by me or distortions of diagnostic images or specimens that may result from electronic transmissions. I understand that the  practice of medicine is not an Chief Strategy Officer and that Practitioner makes no warranties or guarantees regarding treatment outcomes. I acknowledge that I will receive a copy of this consent concurrently upon execution via email to the email address I  last provided but may also request a printed copy by calling the office of [facility].    I understand that my insurance will be billed for this visit.   I have read or had this consent read to me.  I understand the contents of this consent, which adequately explains the benefits and risks of the Services being provided via telemedicine.   I have been provided ample opportunity to ask questions regarding this consent and the Services and have had my questions answered to my satisfaction.  I give my informed consent for the services to be provided through the use of telemedicine in my medical care  By participating in this telemedicine visit I agree to the above.

## 2018-10-08 ENCOUNTER — Encounter: Payer: Self-pay | Admitting: Cardiology

## 2018-10-08 ENCOUNTER — Telehealth (INDEPENDENT_AMBULATORY_CARE_PROVIDER_SITE_OTHER): Payer: 59 | Admitting: Cardiology

## 2018-10-08 VITALS — Ht 71.0 in | Wt 240.0 lb

## 2018-10-08 DIAGNOSIS — R079 Chest pain, unspecified: Secondary | ICD-10-CM

## 2018-10-08 DIAGNOSIS — I2510 Atherosclerotic heart disease of native coronary artery without angina pectoris: Secondary | ICD-10-CM

## 2018-10-08 DIAGNOSIS — E7800 Pure hypercholesterolemia, unspecified: Secondary | ICD-10-CM

## 2018-10-08 NOTE — Patient Instructions (Signed)
Medication Instructions:  NO CHANGE If you need a refill on your cardiac medications before your next appointment, please call your pharmacy.   Lab work: If you have labs (blood work) drawn today and your tests are completely normal, you will receive your results only by: . MyChart Message (if you have MyChart) OR . A paper copy in the mail If you have any lab test that is abnormal or we need to change your treatment, we will call you to review the results.  Follow-Up: At [facility], you and your health needs are our priority.  As part of our continuing mission to provide you with exceptional heart care, we have created designated Provider Care Teams.  These Care Teams include your primary Cardiologist (physician) and Advanced Practice Providers (APPs -  Physician Assistants and Nurse Practitioners) who all work together to provide you with the care you need, when you need it. You will need a follow up appointment in 12 months.  Please call our office 2 months in advance to schedule this appointment.  You may see Brian Crenshaw, MD or one of the following Advanced Practice Providers on your designated Care Team:   Luke Kilroy, PA-C Krista Kroeger, PA-C . Callie Goodrich, PA-C     

## 2019-06-16 ENCOUNTER — Ambulatory Visit: Payer: 59 | Attending: Internal Medicine

## 2019-06-16 DIAGNOSIS — Z23 Encounter for immunization: Secondary | ICD-10-CM

## 2019-06-16 NOTE — Progress Notes (Signed)
   Covid-19 Vaccination Clinic  Name:  Gavin Parker    MRN: [id number] DOB: 13-Mar-1966  06/16/2019  [name] was observed post Covid-19 immunization for 15 minutes without incident. He was provided with Vaccine Information Sheet and instruction to access the V-Safe system.   [name] was instructed to call 911 with any severe reactions post vaccine: Marland Kitchen Difficulty breathing  . Swelling of face and throat  . A fast heartbeat  . A bad rash all over body  . Dizziness and weakness   Immunizations Administered    Name Date Dose VIS Date Route   Pfizer COVID-19 Vaccine 06/16/2019 12:11 PM 0.3 mL 03/11/2019 Intramuscular   Manufacturer: [place]   Lot: [id number]   [place]: [id number]

## 2019-07-11 ENCOUNTER — Ambulatory Visit: Payer: 59 | Attending: Internal Medicine

## 2019-07-11 DIAGNOSIS — Z23 Encounter for immunization: Secondary | ICD-10-CM

## 2019-07-11 NOTE — Progress Notes (Signed)
   Covid-19 Vaccination Clinic  Name:  Gavin Parker    MRN: [id number] DOB: Dec 28, 1965  07/11/2019  [name] was observed post Covid-19 immunization for 15 minutes without incident. He was provided with Vaccine Information Sheet and instruction to access the V-Safe system.   [name] was instructed to call 911 with any severe reactions post vaccine: Marland Kitchen Difficulty breathing  . Swelling of face and throat  . A fast heartbeat  . A bad rash all over body  . Dizziness and weakness   Immunizations Administered    Name Date Dose VIS Date Route   Pfizer COVID-19 Vaccine 07/11/2019 10:35 AM 0.3 mL 03/11/2019 Intramuscular   Manufacturer: [place]   Lot: [id number]   [place]: [id number]

## 2020-01-13 ENCOUNTER — Ambulatory Visit: Payer: 59 | Admitting: Cardiology

## 2020-01-18 ENCOUNTER — Ambulatory Visit: Payer: 59 | Admitting: Cardiology

## 2020-01-23 NOTE — Progress Notes (Deleted)
HPI: FU CAD.  Patient has had difficulties with chronic chest pain.  Echocardiogram June 2015 showed normal LV function.  Cardiac catheterization January 2017 showed 30% RCA, 40% LAD and 20% circumflex.  Normal LV function. Since he was last seen   Current Outpatient Medications  Medication Sig Dispense Refill  . albuterol (VENTOLIN HFA) 108 (90 BASE) MCG/ACT inhaler Inhale 2 puffs into the lungs every 4 (four) hours as needed for wheezing or shortness of breath.     Marland Kitchen aspirin 81 MG tablet Take 81 mg by mouth daily.      . cetirizine (ZYRTEC) 10 MG tablet Take 1 tablet (10 mg total) by mouth daily. 30 tablet 6  . Cholecalciferol (VITAMIN D) 2000 units tablet Take 1 tablet by mouth every morning.    . fluticasone (FLONASE) 50 MCG/ACT nasal spray Place 2 sprays into both nostrils daily. Reported on 06/29/2015    . gabapentin (NEURONTIN) 300 MG capsule Take 600 mg by mouth 3 (three) times daily.  3  . levothyroxine (SYNTHROID, LEVOTHROID) 112 MCG tablet Take 112 mcg by mouth daily before breakfast.    . liothyronine (CYTOMEL) 5 MCG tablet Take 5 mcg by mouth daily.    . montelukast (SINGULAIR) 10 MG tablet Take 10 mg by mouth at bedtime.    . nitroGLYCERIN (NITROSTAT) 0.4 MG SL tablet Place 1 tablet (0.4 mg total) under the tongue every 5 (five) minutes as needed for chest pain (or arm pain). 25 tablet 3  . RABEprazole (ACIPHEX) 20 MG tablet Take 1 tablet by mouth daily before breakfast.    . simvastatin (ZOCOR) 40 MG tablet Take 40 mg by mouth daily.    Marland Kitchen venlafaxine (EFFEXOR) 37.5 MG tablet Take by mouth.     No current facility-administered medications for this visit.     Past Medical History:  Diagnosis Date  . Anginal pain ([place])   . CAD (coronary artery disease)    non obstructive per cath 09-2010  . GERD (gastroesophageal reflux disease)   . High cholesterol   . Hyperlipidemia   . Hypothyroidism   . 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  Last visit w/ urology June 2011 ([place]); last CT of  the abdomen -chest in [place]:  10/11    Past Surgical History:  Procedure Laterality Date  . CARDIAC CATHETERIZATION  2012   non-obs dz  . CARDIAC CATHETERIZATION N/A 05/01/2015   Procedure: Left Heart Cath and Coronary Angiography;  Surgeon: Belva Crome, MD;  Location: [facility];  Service: Cardiovascular;  Laterality: N/A;  . ORCHIECTOMY     R  . TONSILLECTOMY    . UMBILICAL HERNIA REPAIR      Social History   Socioeconomic History  . Marital status: Married    Spouse name: Not on file  . Number of children: 3  . Years of education: Not on file  . Highest education level: Not on file  Occupational History  . Occupation: Buyer, retail: [employer]  Tobacco Use  . Smoking status: Former Smoker    Quit date: 03/31/2012    Years since quitting: 7.8  . Smokeless tobacco: Never Used  Substance and Sexual Activity  . Alcohol use: Yes    Comment: 2 per week  . Drug use: No  . Sexual activity: Not on file  Other Topics Concern  . Not on file  Social History Narrative  . Not on file  Social Determinants of Health   Financial Resource Strain:   . Difficulty of Paying Living Expenses: Not on file  Food Insecurity:   . Worried About Charity fundraiser in the Last Year: Not on file  . Ran Out of Food in the Last Year: Not on file  Transportation Needs:   . Lack of Transportation (Medical): Not on file  . Lack of Transportation (Non-Medical): Not on file  Physical Activity:   . Days of Exercise per Week: Not on file  . Minutes of Exercise per Session: Not on file  Stress:   . Feeling of Stress : Not on file  Social Connections:   . Frequency of Communication with Friends and Family: Not on file  . Frequency of Social Gatherings with Friends and Family: Not on file  . Attends Religious Services: Not on file  . Active Member of Clubs or Organizations: Not on file  . Attends Archivist Meetings: Not  on file  . Marital Status: Not on file  Intimate Partner Violence:   . Fear of Current or Ex-Partner: Not on file  . Emotionally Abused: Not on file  . Physically Abused: Not on file  . Sexually Abused: Not on file    Family History  Problem Relation Age of Onset  . Arthritis Father   . Pancreatic cancer Father   . Diabetes Mother   . Ovarian cancer Mother   . Cancer Maternal Grandfather        metastatic   . Heart attack Neg Hx     ROS: no fevers or chills, productive cough, hemoptysis, dysphasia, odynophagia, melena, hematochezia, dysuria, hematuria, rash, seizure activity, orthopnea, PND, pedal edema, claudication. Remaining systems are negative.  Physical Exam: Well-developed well-nourished in no acute distress.  Skin is warm and dry.  HEENT is normal.  Neck is supple.  Chest is clear to auscultation with normal expansion.  Cardiovascular exam is regular rate and rhythm.  Abdominal exam nontender or distended. No masses palpated. Extremities show no edema. neuro grossly intact  ECG- personally reviewed  A/P  1 coronary artery disease-patient doing well with no chest pain.  Plan to continue medical therapy with aspirin and statin.  2 hyperlipidemia-continue statin.  3 history of chest pain-no recurrent symptoms.  Kirk Ruths, MD

## 2020-01-27 ENCOUNTER — Ambulatory Visit: Payer: 59 | Admitting: Cardiology

## 2020-04-25 NOTE — Progress Notes (Signed)
HPI: FU CAD.  Patient has had difficulties with chronic chest pain.  Echocardiogram June 2015 showed normal LV function.  Cardiac catheterization January 2017 showed 30% RCA, 40% LAD and 20% circumflex.  Normal LV function. Since he was last seen the patient has dyspnea with more extreme activities but not with routine activities. It is relieved with rest. It is not associated with chest pain. There is no orthopnea, PND or pedal edema. There is no syncope or palpitations. There is no exertional chest pain.   Current Outpatient Medications  Medication Sig Dispense Refill  . albuterol (VENTOLIN HFA) 108 (90 Base) MCG/ACT inhaler Inhale 2 puffs into the lungs every 4 (four) hours as needed for wheezing or shortness of breath.     Marland Kitchen aspirin 81 MG tablet Take 81 mg by mouth daily.    . cetirizine (ZYRTEC) 10 MG tablet Take 1 tablet (10 mg total) by mouth daily. 30 tablet 6  . Cholecalciferol (VITAMIN D) 2000 units tablet Take 1 tablet by mouth every morning.    . fluticasone (FLONASE) 50 MCG/ACT nasal spray Place 2 sprays into both nostrils daily. Reported on 06/29/2015    . gabapentin (NEURONTIN) 300 MG capsule Take 600 mg by mouth 3 (three) times daily.  3  . levothyroxine (SYNTHROID, LEVOTHROID) 112 MCG tablet Take 112 mcg by mouth daily before breakfast.    . liothyronine (CYTOMEL) 5 MCG tablet Take 5 mcg by mouth daily.    . montelukast (SINGULAIR) 10 MG tablet Take 10 mg by mouth at bedtime.    . nitroGLYCERIN (NITROSTAT) 0.4 MG SL tablet Place 1 tablet (0.4 mg total) under the tongue every 5 (five) minutes as needed for chest pain (or arm pain). 25 tablet 3  . RABEprazole (ACIPHEX) 20 MG tablet Take 1 tablet by mouth daily before breakfast.    . simvastatin (ZOCOR) 40 MG tablet Take 40 mg by mouth daily.    . tamsulosin (FLOMAX) 0.4 MG CAPS capsule Take 2 capsules by mouth at bedtime.    Marland Kitchen venlafaxine (EFFEXOR) 37.5 MG tablet Take by mouth.     No current facility-administered  medications for this visit.     Past Medical History:  Diagnosis Date  . Anginal pain ([place])   . CAD (coronary artery disease)    non obstructive per cath 09-2010  . GERD (gastroesophageal reflux disease)   . High cholesterol   . Hyperlipidemia   . Hypothyroidism   . 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 Last visit w/ urology June 2011 ([place]); last CT of  the abdomen -chest in [place]:  10/11    Past Surgical History:  Procedure Laterality Date  . CARDIAC CATHETERIZATION  2012   non-obs dz  . CARDIAC CATHETERIZATION N/A 05/01/2015   Procedure: Left Heart Cath and Coronary Angiography;  Surgeon: Belva Crome, MD;  Location: [place];  Service: Cardiovascular;  Laterality: N/A;  . ORCHIECTOMY     R  . TONSILLECTOMY    . UMBILICAL HERNIA REPAIR      Social History   Socioeconomic History  . Marital status: Married    Spouse name: Not on file  . Number of children: 3  . Years of education: Not on file  . Highest education level: Not on file  Occupational History  . Occupation: Buyer, retail: [employer]  Tobacco Use  . Smoking status: Former Smoker    Quit date: 03/31/2012  Years since quitting: 8.1  . Smokeless tobacco: Never Used  Substance and Sexual Activity  . Alcohol use: Yes    Comment: 2 per week  . Drug use: No  . Sexual activity: Not on file  Other Topics Concern  . Not on file  Social History Narrative  . Not on file   Social Determinants of Health   Financial Resource Strain: Not on file  Food Insecurity: Not on file  Transportation Needs: Not on file  Physical Activity: Not on file  Stress: Not on file  Social Connections: Not on file  Intimate Partner Violence: Not on file    Family History  Problem Relation Age of Onset  . Arthritis Father   . Pancreatic cancer Father   . Diabetes Mother   . Ovarian cancer Mother   . Cancer Maternal Grandfather        metastatic   . Heart attack Neg Hx      ROS: no fevers or chills, productive cough, hemoptysis, dysphasia, odynophagia, melena, hematochezia, dysuria, hematuria, rash, seizure activity, orthopnea, PND, pedal edema, claudication. Remaining systems are negative.  Physical Exam: Well-developed well-nourished in no acute distress.  Skin is warm and dry.  HEENT is normal.  Neck is supple.  Chest is clear to auscultation with normal expansion.  Cardiovascular exam is regular rate and rhythm.  Abdominal exam nontender or distended. No masses palpated. Extremities show no edema. neuro grossly intact  ECG-normal sinus rhythm, no ST changes.  Personally reviewed  A/P  1 coronary artery disease-patient doing well with no chest pain.  Continue medical therapy with aspirin and statin.  2 hyperlipidemia-continue statin.  Note he did not tolerate Crestor, Lipitor or pravastatin previously.  I will add Zetia to low-dose Zocor as his most recent LDL was greater than 100.  Check lipids and liver in 12 weeks.  3 obstructive sleep apnea-follow-up pulmonary.  Kirk Ruths, MD

## 2020-05-08 ENCOUNTER — Ambulatory Visit: Payer: 59 | Admitting: Cardiology

## 2020-05-08 ENCOUNTER — Encounter: Payer: Self-pay | Admitting: Cardiology

## 2020-05-08 ENCOUNTER — Other Ambulatory Visit: Payer: Self-pay

## 2020-05-08 VITALS — BP 114/72 | HR 70 | Ht 71.0 in | Wt 250.0 lb

## 2020-05-08 DIAGNOSIS — E7800 Pure hypercholesterolemia, unspecified: Secondary | ICD-10-CM | POA: Diagnosis not present

## 2020-05-08 DIAGNOSIS — I10 Essential (primary) hypertension: Secondary | ICD-10-CM | POA: Diagnosis not present

## 2020-05-08 DIAGNOSIS — I2510 Atherosclerotic heart disease of native coronary artery without angina pectoris: Secondary | ICD-10-CM | POA: Diagnosis not present

## 2020-05-08 MED ORDER — EZETIMIBE 10 MG PO TABS: 10.0000 mg | ORAL_TABLET | Freq: Every day | ORAL | 3 refills | Status: AC

## 2020-05-08 NOTE — Patient Instructions (Signed)
Medication Instructions:   START EZETIMIBE 10 MG ONCE DAILY  *If you need a refill on your cardiac medications before your next appointment, please call your pharmacy*   Lab Work:  Your physician recommends that you return for lab work in: 12 WEEKS -FASTING  If you have labs (blood work) drawn today and your tests are completely normal, you will receive your results only by: . MyChart Message (if you have MyChart) OR . A paper copy in the mail If you have any lab test that is abnormal or we need to change your treatment, we will call you to review the results.   Follow-Up: At [facility], you and your health needs are our priority.  As part of our continuing mission to provide you with exceptional heart care, we have created designated Provider Care Teams.  These Care Teams include your primary Cardiologist (physician) and Advanced Practice Providers (APPs -  Physician Assistants and Nurse Practitioners) who all work together to provide you with the care you need, when you need it.  We recommend signing up for the patient portal called "MyChart".  Sign up information is provided on this After Visit Summary.  MyChart is used to connect with patients for Virtual Visits (Telemedicine).  Patients are able to view lab/test results, encounter notes, upcoming appointments, etc.  Non-urgent messages can be sent to your provider as well.   To learn more about what you can do with MyChart, go to [email or web address].    Your next appointment:   12 month(s)  The format for your next appointment:   In Person  Provider:   Brian Crenshaw, MD    

## 2020-05-28 NOTE — Addendum Note (Signed)
Addended by: Zebedee Iba on: 05/28/2020 07:04 AM   Modules accepted: Orders

## 2020-08-15 ENCOUNTER — Other Ambulatory Visit: Payer: Self-pay | Admitting: *Deleted

## 2020-08-15 DIAGNOSIS — E7800 Pure hypercholesterolemia, unspecified: Secondary | ICD-10-CM

## 2020-08-15 LAB — HEPATIC FUNCTION PANEL
ALT: 41 IU/L (ref 0–44)
AST: 28 IU/L (ref 0–40)
Albumin: 4.7 g/dL (ref 3.8–4.9)
Alkaline Phosphatase: 84 IU/L (ref 44–121)
Bilirubin Total: 0.4 mg/dL (ref 0.0–1.2)
Bilirubin, Direct: 0.13 mg/dL (ref 0.00–0.40)
Total Protein: 6.8 g/dL (ref 6.0–8.5)

## 2020-08-15 LAB — LIPID PANEL
Chol/HDL Ratio: 3.5 ratio (ref 0.0–5.0)
Cholesterol, Total: 161 mg/dL (ref 100–199)
HDL: 46 mg/dL (ref >39)
LDL Chol Calc (NIH): 92 mg/dL (ref 0–99)
Triglycerides: 132 mg/dL (ref 0–149)
VLDL Cholesterol Cal: 23 mg/dL (ref 5–40)

## 2020-09-10 ENCOUNTER — Telehealth: Payer: Self-pay | Admitting: Cardiology

## 2020-09-10 DIAGNOSIS — E7800 Pure hypercholesterolemia, unspecified: Secondary | ICD-10-CM

## 2020-09-10 DIAGNOSIS — I2510 Atherosclerotic heart disease of native coronary artery without angina pectoris: Secondary | ICD-10-CM

## 2020-09-10 MED ORDER — ROSUVASTATIN CALCIUM 20 MG PO TABS: 20.0000 mg | ORAL_TABLET | Freq: Every day | ORAL | 3 refills | Status: AC

## 2020-09-10 NOTE — Telephone Encounter (Signed)
Spoke with pt, Aware of dr crenshaw's recommendations.  ?New script sent to the pharmacy  ?Lab orders mailed to the pt  ?

## 2020-09-10 NOTE — Telephone Encounter (Signed)
Spoke to patient he stated he does not have insurance.Stated he needs to cancel lipid clinic appointment 6/15.Stated he would like to try Crestor again.He does not remember having a reaction.Advised I will send message to [facility] for advice.

## 2020-09-10 NOTE — Telephone Encounter (Signed)
Pt called in stating that hes been out of work and receiving workers comp, due to this his employer has cancelled his insurance, he is wanting to cancel upcoming appt but is concerned about receiving his meds, please advise.

## 2020-09-12 ENCOUNTER — Ambulatory Visit: Payer: Self-pay

## 2023-11-30 DEATH — deceased
